# Patient Record
Sex: Male | Born: 1935 | Race: White | Hispanic: No | Smoking: Former smoker
Health system: Southern US, Community
[De-identification: ages and names within clinical notes are randomized; demographics above are authoritative.]

## PROBLEM LIST (undated history)

## (undated) DIAGNOSIS — I2510 Atherosclerotic heart disease of native coronary artery without angina pectoris: Secondary | ICD-10-CM

## (undated) DIAGNOSIS — K219 Gastro-esophageal reflux disease without esophagitis: Secondary | ICD-10-CM

## (undated) DIAGNOSIS — G4730 Sleep apnea, unspecified: Secondary | ICD-10-CM

## (undated) DIAGNOSIS — I10 Essential (primary) hypertension: Secondary | ICD-10-CM

## (undated) HISTORY — DX: Atherosclerotic heart disease of native coronary artery without angina pectoris: I25.10

## (undated) HISTORY — DX: Gastro-esophageal reflux disease without esophagitis: K21.9

---

## 2000-02-12 ENCOUNTER — Emergency Department (HOSPITAL_COMMUNITY): Admission: EM | Admit: 2000-02-12 | Discharge: 2000-02-13 | Payer: Self-pay | Admitting: Emergency Medicine

## 2000-05-24 ENCOUNTER — Ambulatory Visit (HOSPITAL_COMMUNITY): Admission: RE | Admit: 2000-05-24 | Discharge: 2000-05-24 | Payer: Self-pay | Admitting: Family Medicine

## 2000-05-25 ENCOUNTER — Encounter: Payer: Self-pay | Admitting: Family Medicine

## 2001-12-06 ENCOUNTER — Other Ambulatory Visit: Admission: RE | Admit: 2001-12-06 | Discharge: 2001-12-06 | Payer: Self-pay | Admitting: Dermatology

## 2003-01-19 ENCOUNTER — Encounter (INDEPENDENT_AMBULATORY_CARE_PROVIDER_SITE_OTHER): Payer: Self-pay | Admitting: Family Medicine

## 2004-01-25 ENCOUNTER — Inpatient Hospital Stay (HOSPITAL_COMMUNITY): Admission: AD | Admit: 2004-01-25 | Discharge: 2004-01-30 | Payer: Self-pay | Admitting: *Deleted

## 2005-01-18 HISTORY — PX: CORONARY ANGIOPLASTY WITH STENT PLACEMENT: SHX49

## 2005-01-29 ENCOUNTER — Encounter (INDEPENDENT_AMBULATORY_CARE_PROVIDER_SITE_OTHER): Payer: Self-pay | Admitting: Family Medicine

## 2005-01-29 LAB — CONVERTED CEMR LAB: Hgb A1c MFr Bld: 8.6 %

## 2005-09-10 ENCOUNTER — Ambulatory Visit: Payer: Self-pay | Admitting: Family Medicine

## 2005-09-11 ENCOUNTER — Encounter (INDEPENDENT_AMBULATORY_CARE_PROVIDER_SITE_OTHER): Payer: Self-pay | Admitting: Family Medicine

## 2005-09-24 ENCOUNTER — Ambulatory Visit: Payer: Self-pay | Admitting: Family Medicine

## 2005-09-24 LAB — CONVERTED CEMR LAB: Hgb A1c MFr Bld: 7.7 %

## 2005-11-24 ENCOUNTER — Ambulatory Visit: Payer: Self-pay | Admitting: Family Medicine

## 2005-12-24 ENCOUNTER — Ambulatory Visit: Payer: Self-pay | Admitting: Family Medicine

## 2005-12-24 LAB — CONVERTED CEMR LAB: Hgb A1c MFr Bld: 7.2 %

## 2005-12-28 ENCOUNTER — Encounter: Payer: Self-pay | Admitting: Family Medicine

## 2005-12-28 DIAGNOSIS — I10 Essential (primary) hypertension: Secondary | ICD-10-CM

## 2005-12-28 DIAGNOSIS — E785 Hyperlipidemia, unspecified: Secondary | ICD-10-CM

## 2005-12-28 DIAGNOSIS — I2510 Atherosclerotic heart disease of native coronary artery without angina pectoris: Secondary | ICD-10-CM | POA: Insufficient documentation

## 2005-12-28 DIAGNOSIS — K219 Gastro-esophageal reflux disease without esophagitis: Secondary | ICD-10-CM

## 2006-01-28 ENCOUNTER — Ambulatory Visit (HOSPITAL_COMMUNITY): Admission: RE | Admit: 2006-01-28 | Discharge: 2006-01-28 | Payer: Self-pay | Admitting: Family Medicine

## 2006-01-28 ENCOUNTER — Ambulatory Visit: Payer: Self-pay | Admitting: Family Medicine

## 2006-03-23 ENCOUNTER — Encounter (INDEPENDENT_AMBULATORY_CARE_PROVIDER_SITE_OTHER): Payer: Self-pay | Admitting: Family Medicine

## 2006-04-29 ENCOUNTER — Ambulatory Visit: Payer: Self-pay | Admitting: Family Medicine

## 2006-04-29 LAB — CONVERTED CEMR LAB
HDL goal, serum: 40 mg/dL
LDL Goal: 70 mg/dL

## 2006-05-02 ENCOUNTER — Encounter (INDEPENDENT_AMBULATORY_CARE_PROVIDER_SITE_OTHER): Payer: Self-pay | Admitting: Family Medicine

## 2006-05-03 LAB — CONVERTED CEMR LAB
ALT: 13 units/L (ref 0–53)
BUN: 21 mg/dL (ref 6–23)
CO2: 25 meq/L (ref 19–32)
Chloride: 104 meq/L (ref 96–112)
Creatinine, Ser: 1.17 mg/dL (ref 0.40–1.50)
Hemoglobin: 15.5 g/dL (ref 13.0–17.0)
LDL Cholesterol: 101 mg/dL — ABNORMAL HIGH (ref 0–99)
Lymphocytes Relative: 36 % (ref 12–46)
Monocytes Absolute: 0.5 10*3/uL (ref 0.2–0.7)
Monocytes Relative: 9 % (ref 3–11)
Neutro Abs: 2.7 10*3/uL (ref 1.7–7.7)
Neutrophils Relative %: 50 % (ref 43–77)
PSA: 0.38 ng/mL (ref 0.10–4.00)
Potassium: 4.7 meq/L (ref 3.5–5.3)
RBC: 5.1 M/uL (ref 4.22–5.81)
Total Protein: 6.7 g/dL (ref 6.0–8.3)
Triglycerides: 196 mg/dL — ABNORMAL HIGH (ref <150)
VLDL: 39 mg/dL (ref 0–40)
WBC: 5.5 10*3/uL (ref 4.0–10.5)

## 2006-05-20 ENCOUNTER — Ambulatory Visit: Payer: Self-pay | Admitting: Family Medicine

## 2006-05-30 ENCOUNTER — Encounter (INDEPENDENT_AMBULATORY_CARE_PROVIDER_SITE_OTHER): Payer: Self-pay | Admitting: Family Medicine

## 2006-06-20 ENCOUNTER — Ambulatory Visit: Payer: Self-pay | Admitting: Internal Medicine

## 2006-07-01 ENCOUNTER — Ambulatory Visit (HOSPITAL_COMMUNITY): Admission: RE | Admit: 2006-07-01 | Discharge: 2006-07-01 | Payer: Self-pay | Admitting: Internal Medicine

## 2006-07-01 ENCOUNTER — Ambulatory Visit: Payer: Self-pay | Admitting: Internal Medicine

## 2006-07-01 HISTORY — PX: COLONOSCOPY: SHX174

## 2006-07-11 ENCOUNTER — Encounter (INDEPENDENT_AMBULATORY_CARE_PROVIDER_SITE_OTHER): Payer: Self-pay | Admitting: Family Medicine

## 2006-07-15 ENCOUNTER — Ambulatory Visit: Payer: Self-pay | Admitting: Family Medicine

## 2006-07-15 DIAGNOSIS — N401 Enlarged prostate with lower urinary tract symptoms: Secondary | ICD-10-CM

## 2006-07-25 ENCOUNTER — Encounter (INDEPENDENT_AMBULATORY_CARE_PROVIDER_SITE_OTHER): Payer: Self-pay | Admitting: Family Medicine

## 2006-07-28 ENCOUNTER — Telehealth (INDEPENDENT_AMBULATORY_CARE_PROVIDER_SITE_OTHER): Payer: Self-pay | Admitting: Family Medicine

## 2006-08-26 ENCOUNTER — Ambulatory Visit: Payer: Self-pay | Admitting: Family Medicine

## 2006-08-26 LAB — CONVERTED CEMR LAB: Hgb A1c MFr Bld: 8.2 %

## 2006-08-29 ENCOUNTER — Ambulatory Visit: Payer: Self-pay | Admitting: Family Medicine

## 2006-08-30 ENCOUNTER — Telehealth (INDEPENDENT_AMBULATORY_CARE_PROVIDER_SITE_OTHER): Payer: Self-pay | Admitting: *Deleted

## 2006-08-30 LAB — CONVERTED CEMR LAB
ALT: 12 units/L (ref 0–53)
CO2: 24 meq/L (ref 19–32)
Cholesterol: 199 mg/dL (ref 0–200)
Creatinine, Ser: 1.09 mg/dL (ref 0.40–1.50)
Glucose, Bld: 146 mg/dL — ABNORMAL HIGH (ref 70–99)
Total Bilirubin: 0.6 mg/dL (ref 0.3–1.2)
Total CHOL/HDL Ratio: 6.2
Triglycerides: 185 mg/dL — ABNORMAL HIGH (ref <150)
VLDL: 37 mg/dL (ref 0–40)

## 2006-09-09 ENCOUNTER — Ambulatory Visit: Payer: Self-pay | Admitting: Family Medicine

## 2006-10-14 ENCOUNTER — Encounter (INDEPENDENT_AMBULATORY_CARE_PROVIDER_SITE_OTHER): Payer: Self-pay | Admitting: Family Medicine

## 2006-11-18 ENCOUNTER — Encounter (INDEPENDENT_AMBULATORY_CARE_PROVIDER_SITE_OTHER): Payer: Self-pay | Admitting: Family Medicine

## 2006-11-25 ENCOUNTER — Ambulatory Visit: Payer: Self-pay | Admitting: Family Medicine

## 2006-11-25 ENCOUNTER — Telehealth (INDEPENDENT_AMBULATORY_CARE_PROVIDER_SITE_OTHER): Payer: Self-pay | Admitting: *Deleted

## 2006-11-25 LAB — CONVERTED CEMR LAB
Glucose, Bld: 204 mg/dL
Hgb A1c MFr Bld: 7.5 %

## 2006-11-28 ENCOUNTER — Telehealth (INDEPENDENT_AMBULATORY_CARE_PROVIDER_SITE_OTHER): Payer: Self-pay | Admitting: *Deleted

## 2007-01-16 ENCOUNTER — Telehealth (INDEPENDENT_AMBULATORY_CARE_PROVIDER_SITE_OTHER): Payer: Self-pay | Admitting: *Deleted

## 2007-02-16 ENCOUNTER — Encounter (INDEPENDENT_AMBULATORY_CARE_PROVIDER_SITE_OTHER): Payer: Self-pay | Admitting: Family Medicine

## 2007-02-27 ENCOUNTER — Encounter (INDEPENDENT_AMBULATORY_CARE_PROVIDER_SITE_OTHER): Payer: Self-pay | Admitting: Family Medicine

## 2007-03-02 IMAGING — CR DG SHOULDER 2+V*L*
3 series · 3 of 3 positions shown · non-contrast
Comparison: none

CLINICAL DATA: Left shoulder pain.
 LEFT SHOULDER ? 3 VIEW:

[view not recorded (1 of 3)]
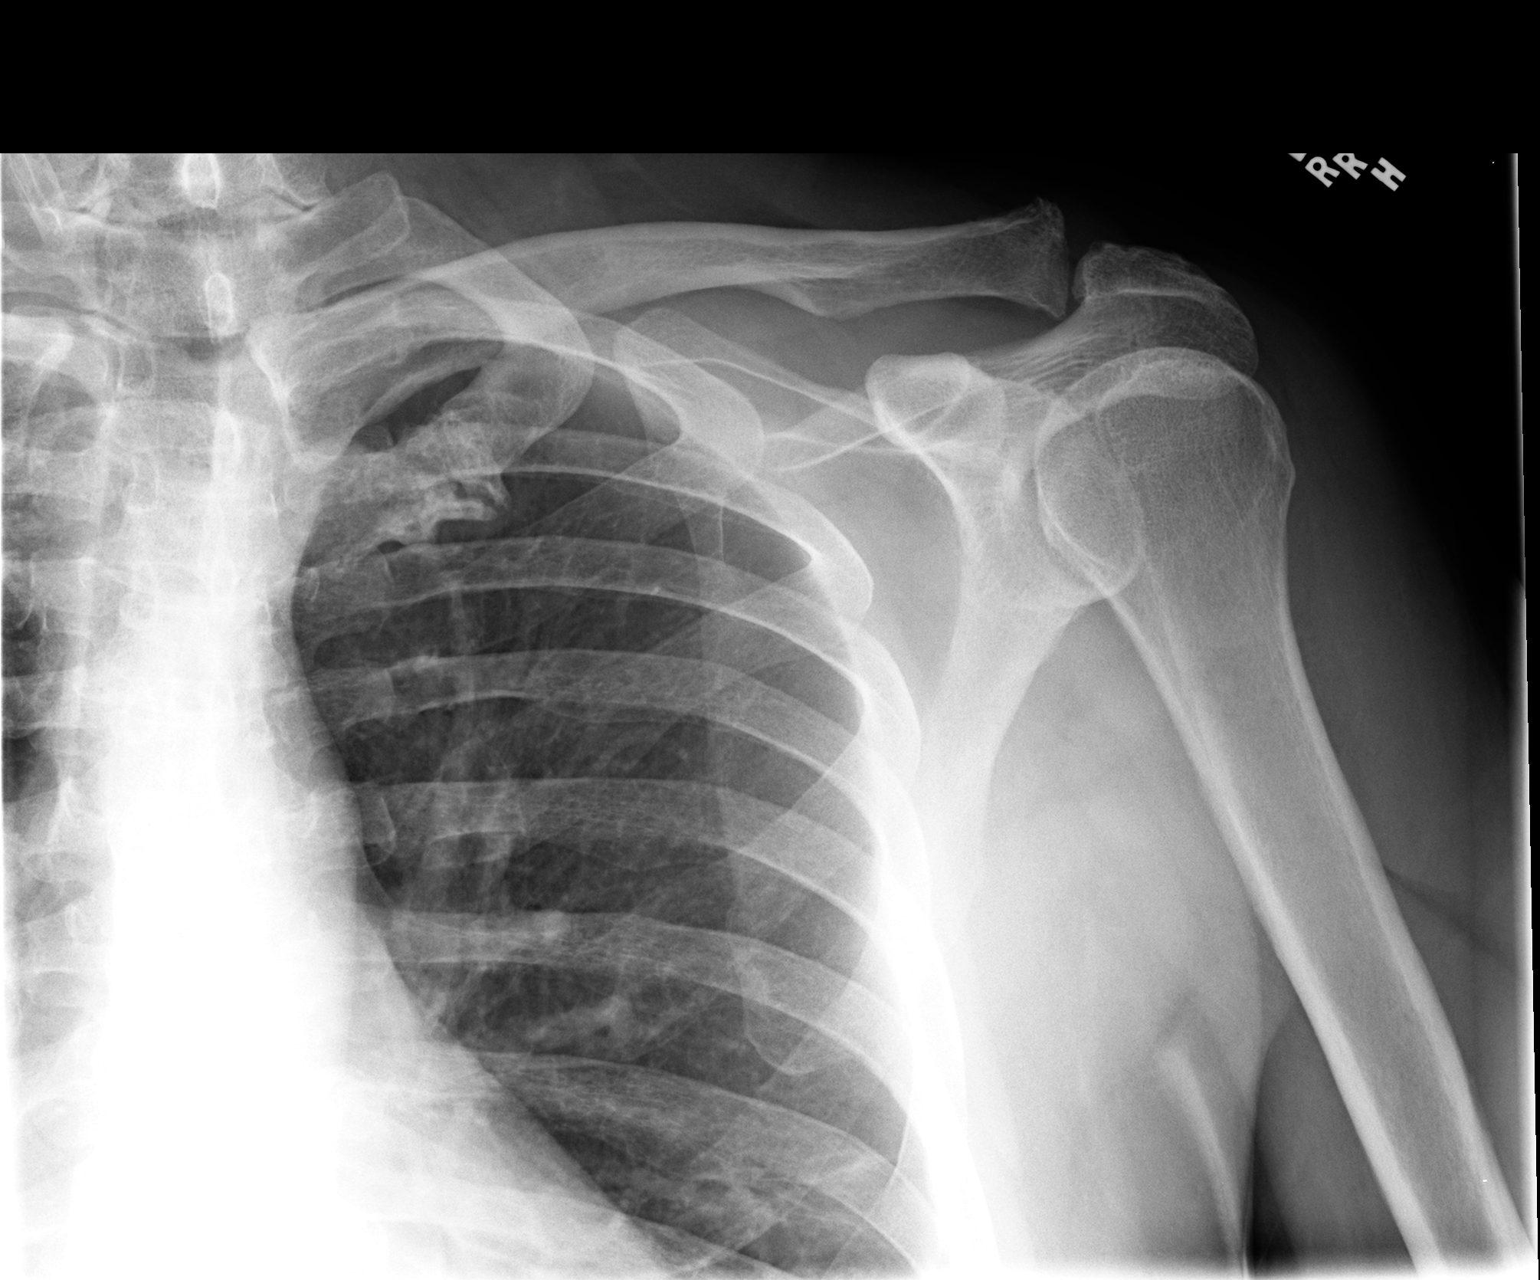

[view not recorded (2 of 3)]
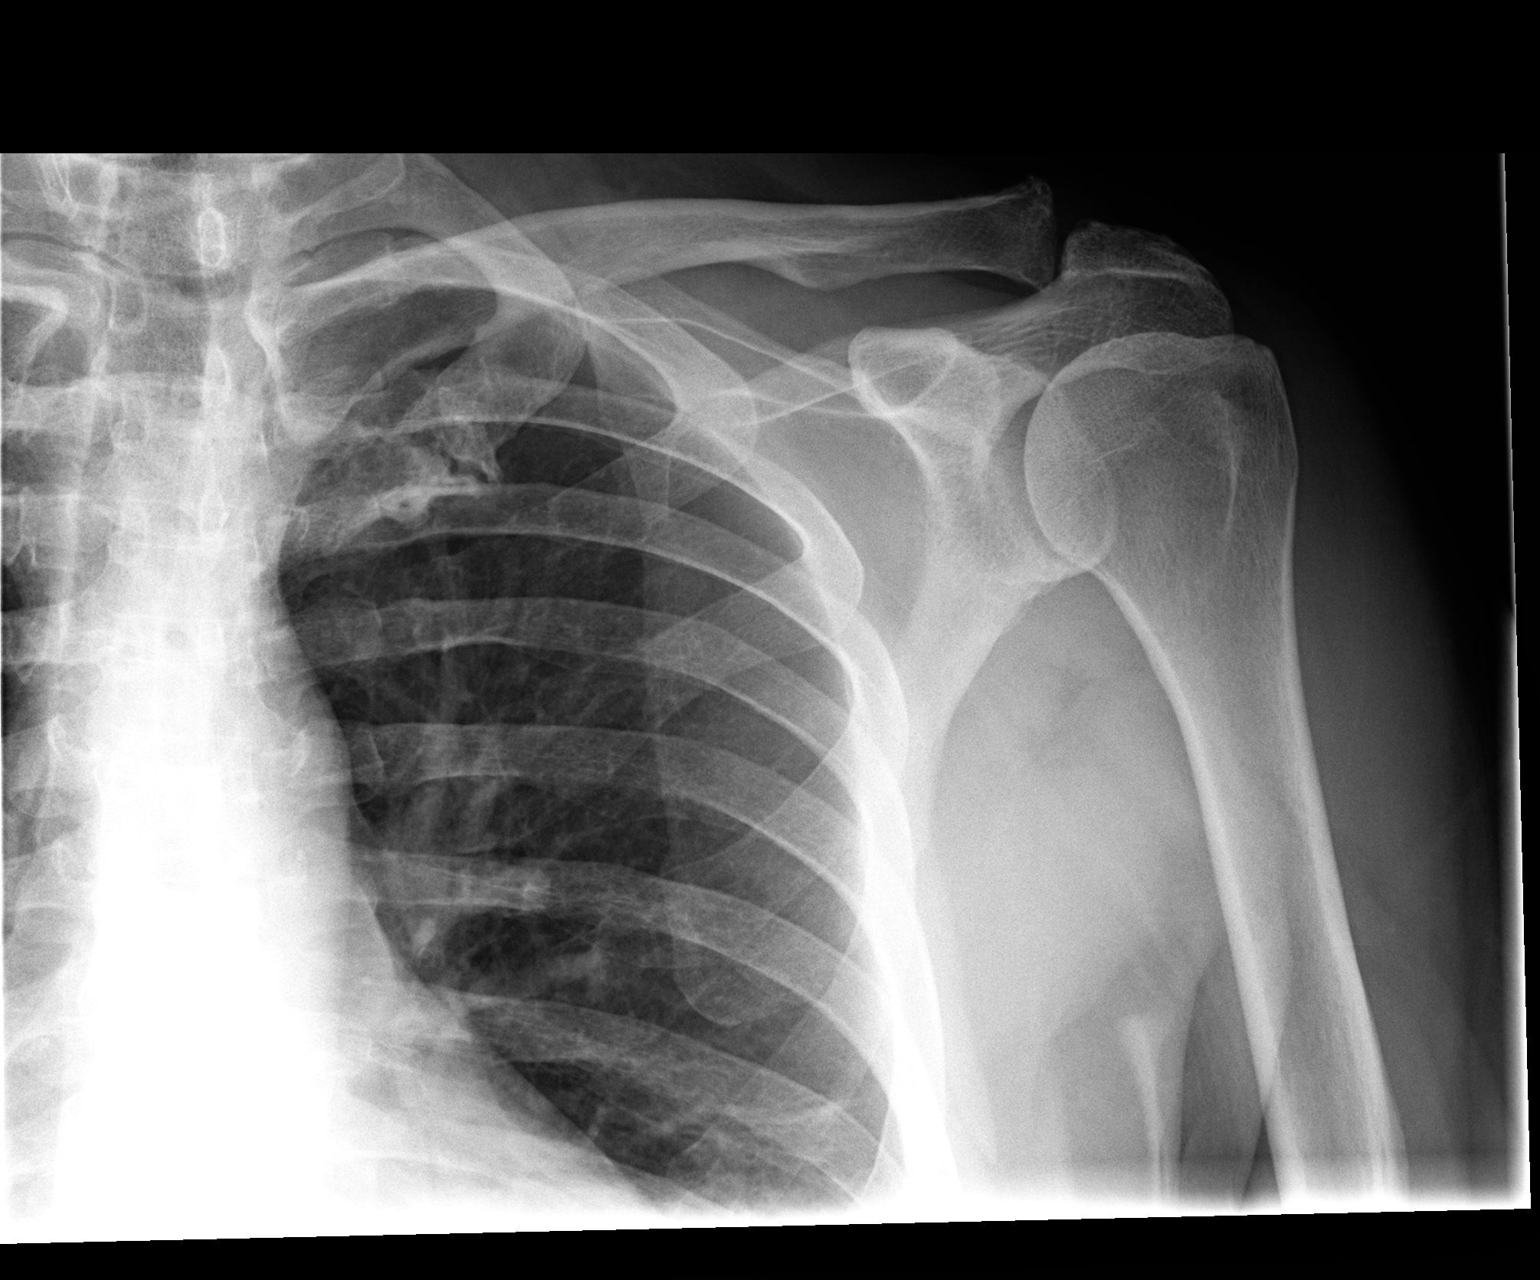

[view not recorded (3 of 3)]
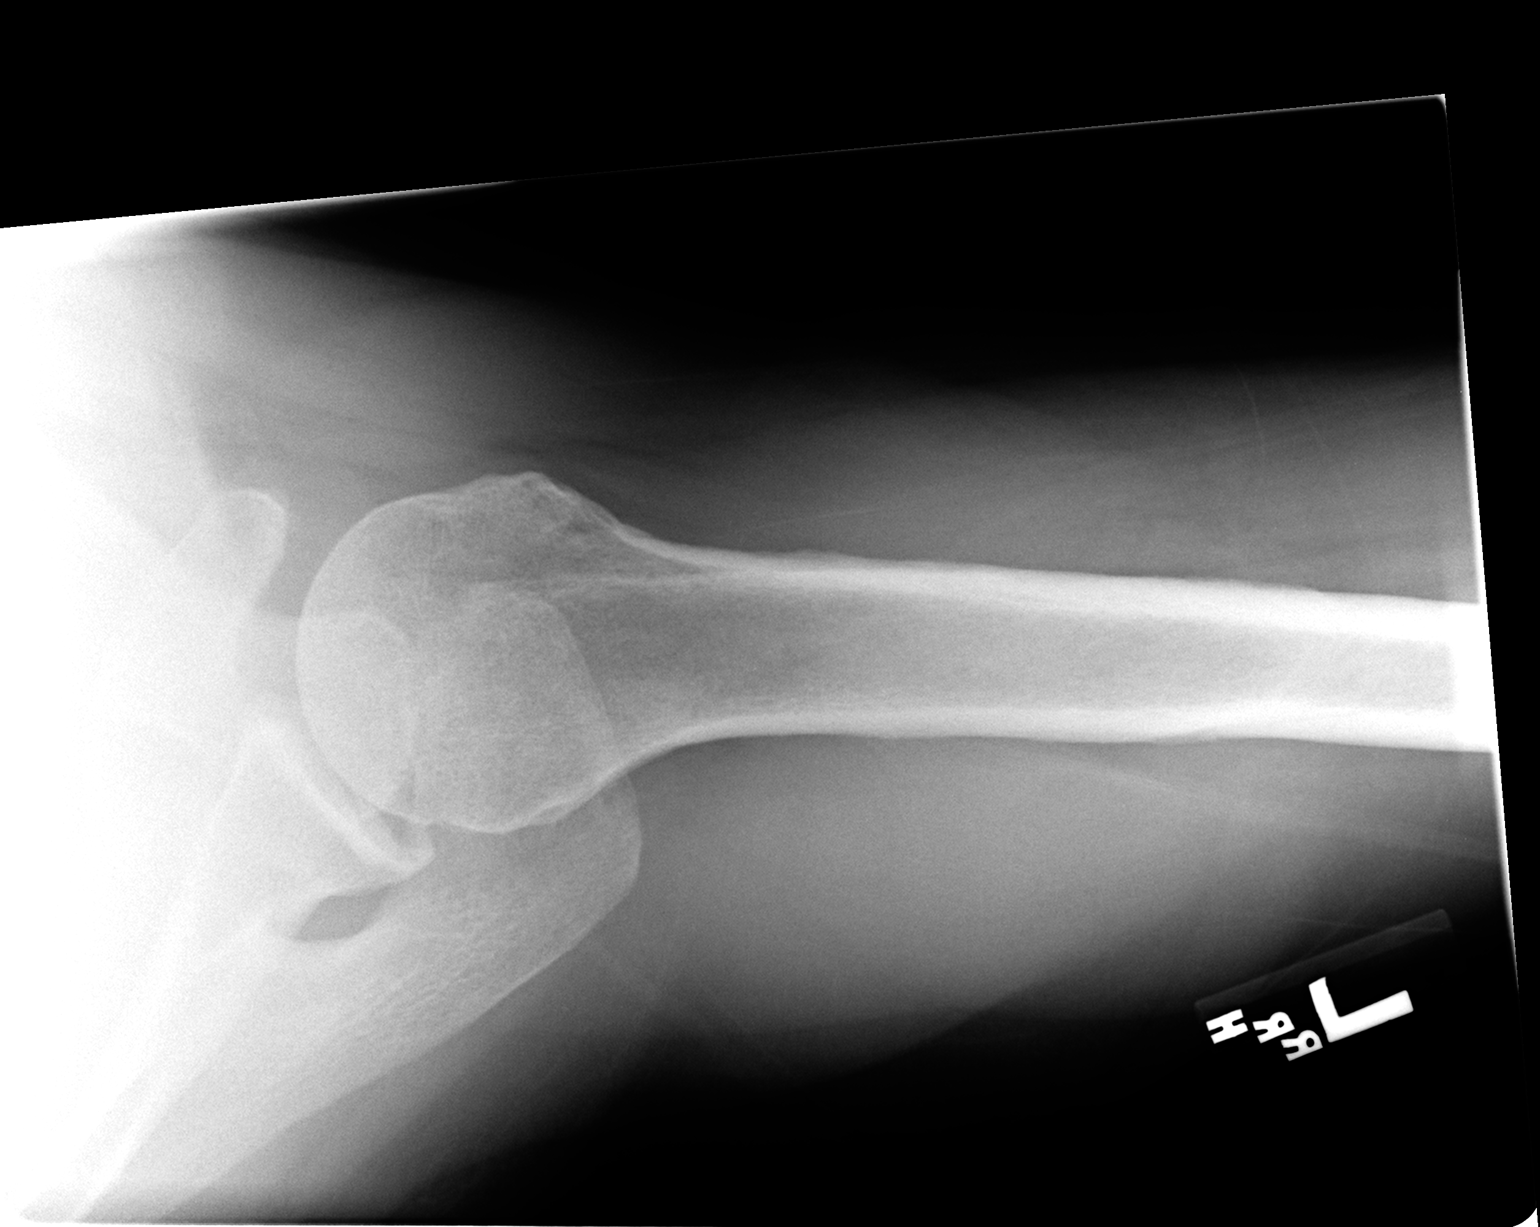

[3 of 3 positions shown; findings below may reference images not displayed]

FINDINGS: The glenohumeral joint is unremarkable.  There is a normal humeral-acromial distance.  The acromion is somewhat sloping, and there is hypertrophic degenerative disease of the AC joint, both of which could result in impingement.
IMPRESSION: No acute finding.  Unfavorable acromial anatomy and hypertrophic degenerative disease of the AC joint which could lead to impingement.

## 2007-03-10 ENCOUNTER — Ambulatory Visit: Payer: Self-pay | Admitting: Family Medicine

## 2007-03-10 LAB — CONVERTED CEMR LAB
Glucose, Bld: 222 mg/dL
Hgb A1c MFr Bld: 8.4 %

## 2007-03-29 ENCOUNTER — Encounter (INDEPENDENT_AMBULATORY_CARE_PROVIDER_SITE_OTHER): Payer: Self-pay | Admitting: Family Medicine

## 2007-04-13 ENCOUNTER — Encounter (INDEPENDENT_AMBULATORY_CARE_PROVIDER_SITE_OTHER): Payer: Self-pay | Admitting: Family Medicine

## 2007-06-06 ENCOUNTER — Encounter (INDEPENDENT_AMBULATORY_CARE_PROVIDER_SITE_OTHER): Payer: Self-pay | Admitting: Family Medicine

## 2007-06-06 LAB — CONVERTED CEMR LAB
AST: 16 units/L (ref 0–37)
Albumin: 4.4 g/dL (ref 3.5–5.2)
BUN: 19 mg/dL (ref 6–23)
Basophils Relative: 1 % (ref 0–1)
Calcium: 9.4 mg/dL (ref 8.4–10.5)
Chloride: 105 meq/L (ref 96–112)
Creatinine, Ser: 1.12 mg/dL (ref 0.40–1.50)
Eosinophils Absolute: 0.3 10*3/uL (ref 0.0–0.7)
Glucose, Bld: 114 mg/dL — ABNORMAL HIGH (ref 70–99)
HDL: 34 mg/dL — ABNORMAL LOW (ref >39)
Hemoglobin: 15.5 g/dL (ref 13.0–17.0)
Lymphs Abs: 2.3 10*3/uL (ref 0.7–4.0)
MCHC: 33.5 g/dL (ref 30.0–36.0)
MCV: 89.2 fL (ref 78.0–100.0)
Monocytes Absolute: 0.6 10*3/uL (ref 0.1–1.0)
Monocytes Relative: 10 % (ref 3–12)
Neutro Abs: 3 10*3/uL (ref 1.7–7.7)
PSA: 0.37 ng/mL (ref 0.10–4.00)
Potassium: 4.3 meq/L (ref 3.5–5.3)
RBC: 5.18 M/uL (ref 4.22–5.81)
Total CHOL/HDL Ratio: 5
Triglycerides: 183 mg/dL — ABNORMAL HIGH (ref <150)
WBC: 6.3 10*3/uL (ref 4.0–10.5)

## 2007-06-07 ENCOUNTER — Ambulatory Visit: Payer: Self-pay | Admitting: Family Medicine

## 2007-07-20 ENCOUNTER — Ambulatory Visit: Payer: Self-pay | Admitting: Family Medicine

## 2007-09-05 ENCOUNTER — Telehealth (INDEPENDENT_AMBULATORY_CARE_PROVIDER_SITE_OTHER): Payer: Self-pay | Admitting: *Deleted

## 2007-09-05 ENCOUNTER — Encounter (INDEPENDENT_AMBULATORY_CARE_PROVIDER_SITE_OTHER): Payer: Self-pay | Admitting: Family Medicine

## 2007-09-07 ENCOUNTER — Ambulatory Visit: Payer: Self-pay | Admitting: Family Medicine

## 2007-09-08 ENCOUNTER — Encounter (INDEPENDENT_AMBULATORY_CARE_PROVIDER_SITE_OTHER): Payer: Self-pay | Admitting: Family Medicine

## 2007-09-11 LAB — CONVERTED CEMR LAB
Albumin: 4.5 g/dL (ref 3.5–5.2)
BUN: 27 mg/dL — ABNORMAL HIGH (ref 6–23)
CO2: 21 meq/L (ref 19–32)
Calcium: 9 mg/dL (ref 8.4–10.5)
Chloride: 102 meq/L (ref 96–112)
Cholesterol: 158 mg/dL (ref 0–200)
Glucose, Bld: 122 mg/dL — ABNORMAL HIGH (ref 70–99)
HDL: 37 mg/dL — ABNORMAL LOW (ref >39)
LDL Cholesterol: 93 mg/dL (ref 0–99)
Potassium: 4.5 meq/L (ref 3.5–5.3)
Sodium: 136 meq/L (ref 135–145)
Total Protein: 7.2 g/dL (ref 6.0–8.3)
Triglycerides: 142 mg/dL (ref <150)

## 2007-09-21 ENCOUNTER — Ambulatory Visit: Payer: Self-pay | Admitting: Family Medicine

## 2007-09-21 DIAGNOSIS — H9190 Unspecified hearing loss, unspecified ear: Secondary | ICD-10-CM | POA: Insufficient documentation

## 2007-09-28 ENCOUNTER — Encounter (INDEPENDENT_AMBULATORY_CARE_PROVIDER_SITE_OTHER): Payer: Self-pay | Admitting: Family Medicine

## 2007-10-03 ENCOUNTER — Encounter (INDEPENDENT_AMBULATORY_CARE_PROVIDER_SITE_OTHER): Payer: Self-pay | Admitting: Family Medicine

## 2007-10-05 ENCOUNTER — Telehealth (INDEPENDENT_AMBULATORY_CARE_PROVIDER_SITE_OTHER): Payer: Self-pay | Admitting: *Deleted

## 2007-10-16 ENCOUNTER — Telehealth (INDEPENDENT_AMBULATORY_CARE_PROVIDER_SITE_OTHER): Payer: Self-pay | Admitting: *Deleted

## 2007-11-13 ENCOUNTER — Ambulatory Visit: Payer: Self-pay | Admitting: Family Medicine

## 2007-12-07 ENCOUNTER — Ambulatory Visit: Payer: Self-pay | Admitting: Family Medicine

## 2007-12-07 DIAGNOSIS — E1165 Type 2 diabetes mellitus with hyperglycemia: Secondary | ICD-10-CM

## 2008-02-01 ENCOUNTER — Ambulatory Visit: Payer: Self-pay | Admitting: Internal Medicine

## 2008-02-12 ENCOUNTER — Telehealth (INDEPENDENT_AMBULATORY_CARE_PROVIDER_SITE_OTHER): Payer: Self-pay | Admitting: *Deleted

## 2008-03-14 ENCOUNTER — Ambulatory Visit: Payer: Self-pay | Admitting: Family Medicine

## 2008-03-14 LAB — CONVERTED CEMR LAB
Glucose, Bld: 172 mg/dL
Hgb A1c MFr Bld: 7.5 %

## 2008-06-13 ENCOUNTER — Ambulatory Visit: Payer: Self-pay | Admitting: Family Medicine

## 2008-06-14 ENCOUNTER — Encounter (INDEPENDENT_AMBULATORY_CARE_PROVIDER_SITE_OTHER): Payer: Self-pay | Admitting: Family Medicine

## 2008-06-14 LAB — CONVERTED CEMR LAB
Creatinine, Urine: 245.4 mg/dL
Microalb, Ur: 1.31 mg/dL (ref 0.00–1.89)

## 2008-07-01 ENCOUNTER — Ambulatory Visit: Payer: Self-pay | Admitting: Family Medicine

## 2008-07-07 ENCOUNTER — Encounter (INDEPENDENT_AMBULATORY_CARE_PROVIDER_SITE_OTHER): Payer: Self-pay | Admitting: Family Medicine

## 2008-07-08 ENCOUNTER — Encounter (INDEPENDENT_AMBULATORY_CARE_PROVIDER_SITE_OTHER): Payer: Self-pay | Admitting: Family Medicine

## 2008-09-13 ENCOUNTER — Ambulatory Visit: Payer: Self-pay | Admitting: Family Medicine

## 2008-09-13 LAB — CONVERTED CEMR LAB: Hgb A1c MFr Bld: 7.9 %

## 2008-09-25 ENCOUNTER — Encounter (INDEPENDENT_AMBULATORY_CARE_PROVIDER_SITE_OTHER): Payer: Self-pay | Admitting: Family Medicine

## 2008-09-26 ENCOUNTER — Encounter (INDEPENDENT_AMBULATORY_CARE_PROVIDER_SITE_OTHER): Payer: Self-pay | Admitting: Family Medicine

## 2008-10-08 ENCOUNTER — Encounter (INDEPENDENT_AMBULATORY_CARE_PROVIDER_SITE_OTHER): Payer: Self-pay | Admitting: Family Medicine

## 2008-11-29 ENCOUNTER — Telehealth: Payer: Self-pay | Admitting: Internal Medicine

## 2009-02-06 ENCOUNTER — Ambulatory Visit: Payer: Self-pay | Admitting: Internal Medicine

## 2009-02-06 DIAGNOSIS — J309 Allergic rhinitis, unspecified: Secondary | ICD-10-CM | POA: Insufficient documentation

## 2009-06-02 ENCOUNTER — Ambulatory Visit (HOSPITAL_COMMUNITY): Admission: RE | Admit: 2009-06-02 | Discharge: 2009-06-02 | Payer: Self-pay | Admitting: Family Medicine

## 2009-12-19 ENCOUNTER — Ambulatory Visit (HOSPITAL_COMMUNITY): Admission: RE | Admit: 2009-12-19 | Discharge: 2009-12-19 | Payer: Self-pay | Admitting: Family Medicine

## 2010-02-15 LAB — CONVERTED CEMR LAB
Blood Glucose, Fasting: 167 mg/dL
Hgb A1c MFr Bld: 7.9 %

## 2010-02-17 NOTE — Assessment & Plan Note (Signed)
Summary: RESCHEDULED FROM 06.13.08   Vital Signs:  Patient Profile:   76 Years Old Male Height:     66 inches (167.64 cm) Weight:      179 pounds BMI:     29.00 O2 Sat:      96 % Temp:     97.3 degrees F Pulse rate:   77 / minute Resp:     16 per minute BP sitting:   123 / 78  Vitals Entered By: [name] (July 15, 2006 2:52 PM)               PCP:  Franchot Heidelberg, MD  Chief Complaint:  follow up visit.  History of Present Illness: Pt in for recheck.  He states he is doing well. He missed his first colonscopy. Had repeat about two weeks ago under Dr. Jena Gauss. States all was normal. He was told to have repeat in 3 to 5 years. Apetite good with no nausea or vomitting. Denies diarrhea and constipation. No blood in stool. He states he still strugglkes with some GERD. Uses Protonix occasionally. Notes he uses it mostly with spicey meals. Notes sx worse after meals.  He states his right testicular pain is better. Notes no dysuria, frequency. No nocturia. He urinates once or twice and has slowing of his stream. Worried about BPH.  He has just increased his Simvastatin. He has no body aches. He denies sleep disturbance. No myalgias. Trying to wtch his diet.  Now presents.    Current Allergies (reviewed today): No known allergies   Past Medical History:    Reviewed history from 12/28/2005 and no changes required:       Coronary artery disease       Diabetes mellitus, type II       GERD       Hyperlipidemia       Hypertension  Past Surgical History:    Reviewed history from 12/28/2005 and no changes required:       stent implants 01.01.06   Family History:    Reviewed history from 04/29/2006 and no changes required:       father: COPD       mother: COPD/ cirrhosis       3 sisters: 1 deceased with colon CA                        2 CAD/ COPD       2 brothers: CAD/ COPD  Social History:    Reviewed history from 04/29/2006 and no changes required:       Married       Never Smoked       Alcohol use-no       Drug use-no    Review of Systems      See HPI   Physical Exam  General:     Well-developed,well-nourished,in no acute distress; alert,appropriate and cooperative throughout examination Lungs:     Normal respiratory effort, chest expands symmetrically. Lungs are clear to auscultation, no crackles or wheezes. Heart:     Normal rate and regular rhythm. S1 and S2 normal without gallop, murmur, click, rub or other extra sounds. Abdomen:     Bowel sounds positive,abdomen soft and non-tender without masses, organomegaly or hernias noted. Extremities:     No clubbing, cyanosis, edema, or deformity noted with normal full range of motion of all joints.      Impression & Recommendations:  Problem # 1:  HYPERLIPIDEMIA (ICD-272.4) Discussed.  Tolerating statin. Claims he got bill for over $300 from [employer] insurance would not pay. They have refiled claim and he is waiting to see otucome. As he has done well with this in past curious if we can defer LFTs. Advised certainly would not be inappropriate to weight 6 more weeks. Will do at that time. Councelled TLC. His updated medication list for this problem includes:    Niaspan 500 Mg Tbcr (Niacin (antihyperlipidemic)) .Marland Kitchen... At bedtime    Simvastatin 40 Mg Tabs (Simvastatin) ..... One at bedtime   Problem # 2:  TESTICULAR PAIN, RIGHT (ICD-608.9) Resolved. Follow and update if redevelops.  Problem # 3:  BENIGN PROSTATIC HYPERTROPHY, WITH OBSTRUCTION (ICD-600.01) Trial Flomax. Risk and benefit discussed. Handout given.  Problem # 4:  GERD (ICD-530.81) Use Protonix daily - supsetc throat clearing post meals due to uncontrolled GERD. Get records on scope from GI. Councelled diet, exersize and weight loss with need to use med daily a must. The following medications were removed from the medication list:    Omeprazole 20 Mg Cpdr (Omeprazole) ..... Once daily  His updated medication list for  this problem includes:    Protonix 40 Mg Tbec (Pantoprazole sodium) ..... Once daily   Medications Added to Medication List This Visit: 1)  Flomax 0.4 Mg Cp24 (Tamsulosin hcl) .... One daily   Patient Instructions: 1)  Please schedule a follow-up appointment in 6 weeks.    Prescriptions: FLOMAX 0.4 MG  CP24 (TAMSULOSIN HCL) One daily  #30 x 3   Entered and Authorized by:   Franchot Heidelberg MD   Signed by:   Franchot Heidelberg MD on 07/15/2006   Method used:   Print then Give to Patient   RxID:   [id number]   Handout requested.

## 2010-02-17 NOTE — Assessment & Plan Note (Signed)
Summary: recurring cough-self ref/apc   Primary Provider/Referring Provider:  Dr. John Giovanni  CC:  Pulmonary Consult.  c/o coughing after eating-productive cough with yellow phelgm x74yrs.  states prevacid helps with this.  also c/o wheezing on and off x1 1/2 yrs.  .  History of Present Illness: 20-Feb-2009- 76 yoM referred by his wife Patsy, our patient, because of cough and wheeze. he describes cough over the past 2 years with yellow phlegm. Intermittent. Prevacid may help. Occasional nose whistle with deep breath. Sneeze, little discharge. Denies significant wheeze or dyspnea. occasional chokes eating and will cough up phlegm after meals. Uncomfortable in pollen seasons. Hx of allergic rhinitis, chest colds. No hx asthma or sinus headache. No hx pneumonia. Had flu and pneumovax.      Current Medications (verified): 1)  Metoprolol Succinate 25 Mg Tb24 (Metoprolol Succinate) .... Two Times A Day 2)  Niaspan 1000 Mg  Tbcr (Niacin (Antihyperlipidemic)) .... One At Bedtime 3)  Simvastatin 40 Mg Tabs (Simvastatin) .... One At Bedtime 4)  Aspirin 81 Mg Tbec (Aspirin) .... Once Daily 5)  Plavix 75 Mg Tabs (Clopidogrel Bisulfate) .... Once Daily 6)  Benazepril-Hydrochlorothiazide 10-12.5 Mg Tabs (Benazepril-Hydrochlorothiazide) .... Once Daily 7)  Nitroquick 0.4 Mg Subl (Nitroglycerin) .... As Needed 8)  Glipizide 10 Mg Tabs (Glipizide) .... One in Am and One Night 9)  Fish Oil 1200 Mg Caps (Omega-3 Fatty Acids) .... One Daily 10)  Lantus Solostar 100 Unit/ml Soln (Insulin Glargine) .... 35 Units [place] Qhs 11)  Pen Needles 1/2" 29g X 12mm Misc (Insulin Pen Needle) .... 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  Risk Factors: Exercise: no (04/29/2006)  Risk Factors: Smoking Status: never (04/29/2006)  Past Medical History: BENIGN PROSTATIC HYPERTROPHY, WITH OBSTRUCTION (ICD-600.01) PREVENTIVE HEALTH CARE (ICD-V70.0) HYPERTENSION (ICD-401.9) HYPERLIPIDEMIA (ICD-272.4) GERD (ICD-530.81) DIABETES MELLITUS, TYPE II, UNCONTROLLED (ICD-250.02) CORONARY ARTERY DISEASE (ICD-414.00)  Family History: father: COPD Dead 28 mother: COPD/ cirrhosis Dead 14 3 sisters: 1 deceased with colon CA - 56                  2 dead at 61 - medication overdose with suicide/ COPD 79 2 brothers: 84  and 47 CAD/ COPD allergies-daughter  Social History: Married Never Smoked Alcohol use-no Drug use-no 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  The patient denies shortness of breath at rest, non-productive cough, coughing up blood, chest pain, irregular heartbeats, loss of appetite, weight change, abdominal pain, difficulty swallowing, sore throat, tooth/dental problems, headaches, nasal congestion/difficulty breathing through nose, sneezing, itching, ear ache, anxiety, depression, hand/feet swelling, joint stiffness or pain, rash, change in color of mucus, and fever.         Loud snore without apnea described. Some daytime sleepiness with frequent naps.  Vital Signs:  Patient profile:  76 year old male Height:      64 inches Weight:      178 pounds BMI:     30.66 O2 Sat:      96 % on Room air Pulse rate:   51 / minute BP sitting:   140 / 70  (left arm) Cuff size:   regular  Vitals Entered  By: Gweneth Dimitri RN (February 06, 2009 9:23 AM)  O2 Flow:  Room air CC: Pulmonary Consult.  c/o coughing after eating-productive cough with yellow phelgm x80yrs.  states prevacid helps with this.  also c/o wheezing on and off x1 1/2 yrs.   Comments Medications reviewed with patient phone number verified with pt Gweneth Dimitri RN  February 06, 2009 9:23 AM    Physical Exam  Additional Exam:  General: A/Ox3; pleasant and cooperative, NAD, mildly overweight SKIN: no rash, lesions NODES: no lymphadenopathy HEENT: [place]/AT, EOM- WNL, hard of hearing, Conjuctivae- clear, PERRLA, TM-WNL, Nose- red mucosa with mucus bridging, Throat- clear and wnl NECK: Supple w/ fair ROM, JVD- none, normal carotid impulses w/o bruits Thyroid- normal to palpation CHEST: Clear to P&A, dry cough only with deep breath HEART: RRR, no m/g/r heard ABDOMEN: Soft and nl; nml bowel sounds; no organomegaly or masses noted UJW:JXBJ, nl pulses, no edema  NEURO: Grossly intact to observation      Impression & Recommendations:  Problem # 1:  ALLERGIC RHINITIS (ICD-477.9)  Sounds like perennial rhinitis with seasonal pollen exacerbation. We will try claritin and a sample steroid nasal spray.  Problem # 2:  ? of SLEEP APNEA, OBSTRUCTIVE (ICD-327.23)  He describes his wife's comments, and probably does have sleep apnea. I've asked him to get a more direct description as to whether he is seen to stop breathing, and I will give a print information booklet. Based on his wife's observation, we may want to do a sleep study.  Other Orders: New Patient Level III (47829)  Patient Instructions: 1)  Please schedule a follow-up appointment in 1 month. 2)  Try otc antihistamine Claritin/ loratadine for itching and sinus drainage as needed. 3)  Try sample Nasonex nasal steroid spray: 2 sprays each nostril once daily til you use up the sample. If it is a big help we will give a prescription. 4)  Look at pamphlet about sleep apnea and  ask your wife if that is what you sound like. We can do a sleep study if needed.

## 2010-06-02 NOTE — Consult Note (Signed)
NAMELASARO, PRIMM             ACCOUNT NO.:  [id number]   MEDICAL RECORD NO.:  [id number]          PATIENT TYPE:  AMB   LOCATION:                                FACILITY:  [facility]   PHYSICIAN:  R. Roetta Sessions, M.D. DATE OF BIRTH:  Nov 07, 1934   DATE OF CONSULTATION:  06/20/2006  DATE OF DISCHARGE:                                 CONSULTATION   REASON FOR CONSULTATION:  1. Consideration of colorectal cancer screening.  2. History of positive family history of colon cancer.   HISTORY OF PRESENT ILLNESS:  Mr. Eoghan Belcher is a very pleasant  76 year old Caucasian male sent over at the courtesy of Dr. Franchot Heidelberg for consideration of colorectal cancer screening.  Mr.  Ruffins tells me he had a colonoscopy by Dr. Elpidio Anis back in 1998.  He was found to have a polyp which was ablated, pathology unknown to me.  He has no bowel symptoms currently.   FAMILY HISTORY:  Positive for colorectal cancer in a sister who was  diagnosed with the disease at age 92.  He has not had any imaging of his  colon since 1998.  He has occasional reflux symptoms for which he takes  pantoprazole 40 mg orally daily.  No odynophagia, dysphagia, early  satiety, nausea or vomiting.  No abdominal pain.  He denies weight loss,  melena, hematochezia, constipation, diarrhea.   PAST MEDICAL HISTORY:  Significant for:  1. Type 2 diabetes mellitus.  2. Hypertension.  3. Gastroesophageal reflux disease.  4. History of coronary artery disease.   PAST SURGICAL:  Coronary angiography with stent placement in 2006.   CURRENT MEDICATIONS:  1. Glyburide 5 mg tablet two b.i.d.  2. Plavix 75 mg daily.  3. NitroQuick 0.4 mg p.r.n.  4. Benazepril 10/12.5 daily.  5. Simvastatin 40 mg at bedtime.  6. Niaspan 500 mg at bedtime.  7. Pantoprazole 40 mg daily.  8. Metoprolol 25 mg daily.  9. Ecotrin 81 mg daily.   ALLERGIES:  NO KNOWN DRUG ALLERGIES.   FAMILY HISTORY:  1. Mother succumbed to old age,  diet at age 67.  2. Father died at age 41.  3. One sister succumbed to colon cancer.  4. One brother died with some respiratory problems.   SOCIAL HISTORY:  1. Patient is married and has 4 children, 1 girl and 3 boys.  2. He drives a truck part-time.  No tobacco, no alcohol, no illicit      drugs.   REVIEW OF SYSTEMS:  As in History of Present Illness.  Has not had any  recent chest pain, dyspnea on exertion.  No change in weight by history.   PHYSICAL EXAMINATION:  A pleasant 76 year old gentleman resting  comfortably.  Weight 179, height 5 feet 4 inches, temp 97.4, BP 120/70,  pulse 60.  Skin warm and dry.  He has a couple of telangiectasia white  lesions in the malar area.  No scleral icterus.  Conjunctiva are pink.  CHEST:  Lungs are clear to auscultation.  CARDIAC EXAM:  Regular rate and rhythm without murmur, gallop or rub.  ABDOMEN:  Nondistended, somewhat protuberant; however, positive bowel  sounds.  Soft, nontender, without appreciable mass or organomegaly.  EXTREMITIES:  No edema.  RECTAL EXAM:  Deferred at the time of colonoscopy.   IMPRESSION:  Mr. Bloom is a pleasant 76 year old Caucasian male with  a personal history of colonic polyps and a positive family history of  colon cancer in a first degree relative/colonoscopy was in 1998.  He has  no lower GI tract symptoms at this time.   He is somewhat overdue for surveillance colonoscopy.   RECOMMENDATIONS:  I have recommended Mr. Streety to have colonoscopy  pretty much in the very near future.  Potential risks, benefits and  alternatives have been reviewed and questions answered, he is agreeable.  Will plan to hold aspirin and Plavix for 4 days prior to the procedure.  Will make further recommendations in the very near future.   I would like to thank Dr. Franchot Heidelberg for allowing me to see this  nice gentleman today.      Jonathon Bellows, M.D.  Electronically Signed     RMR/MEDQ  D:   06/20/2006  T:  06/20/2006  Job:  [id number]

## 2010-06-02 NOTE — Op Note (Signed)
Christopher Fields, WENTWORTH             ACCOUNT NO.:  [id number]   MEDICAL RECORD NO.:  [id number]          PATIENT TYPE:  AMB   LOCATION:  DAY                           FACILITY:  [facility]   PHYSICIAN:  R. Roetta Sessions, M.D. DATE OF BIRTH:  08/19/1934   DATE OF PROCEDURE:  07/01/2006  DATE OF DISCHARGE:                               OPERATIVE REPORT   PROCEDURE:  Screening colonoscopy.   INDICATIONS FOR PROCEDURE:  The patient is a 76 year old gentleman with  a distant history colonic polyps removed by Dr. Katrinka Blazing previously and a  positive family history of colon cancer for a first-degree relatives  (sister),  somewhat overdue for screening.  He has not really had any  lower GI tract symptoms recently.  Colonoscopy is now being done.  This  approach has been discussed with the patient at length.  Potential  risks, benefits and alternatives have been reviewed and questions  answered.  Please see documentation in the medical record.   DESCRIPTION OF PROCEDURE:  Oxygen saturation, blood pressure, pulse and  respiration were monitored throughout the entire procedure.  Conscious  sedation with Versed 3 mg IV and Demerol 50 mg IV in divided doses.  The  instrument was the Pentax video chip system.   FINDINGS:  Digital rectal exam revealed no abnormalities.  The prep was  good.  Examination colonic mucosa was undertaken from the rectosigmoid  junction through the left, transverse, right colon to the appendiceal  orifice, ileocecal valve and cecum.  These structures well seen  photographed for the record.  From this level scope was slowly  withdrawn.  Previous mucosal surfaces were again seen.  The colonic  mucosa appeared normal.  Scope was pulled down into the rectum.  A  thorough examination of the rectal mucosa and retroflex view of the anal  verge demonstrated no abnormality.  The patient tolerated the procedure  well was reacted to endoscopy.   IMPRESSION:  Normal rectum, normal colon.   RECOMMENDATIONS:  Repeat high-risk screening/surveillance colonoscopy in  5 years.  .      R. Roetta Sessions, M.D.  Electronically Signed     RMR/MEDQ  D:  07/01/2006  T:  07/02/2006  Job:  [id number]   cc:   Franchot Heidelberg, M.D.

## 2010-06-05 NOTE — Discharge Summary (Signed)
Fields, Christopher             ACCOUNT NO.:  [id number]   MEDICAL RECORD NO.:  [id number]          PATIENT TYPE:  INP   LOCATION:  A222                         FACILITY:  [facility]   PHYSICIAN:  Mary B. Easley, P.A.-C.DATE OF BIRTH:  [birth year]   DATE OF ADMISSION:  01/25/2004  DATE OF DISCHARGE:  01/28/2004                                 DISCHARGE SUMMARY   ADMISSION DIAGNOSES:  1.  Unstable angina.  2.  Diabetes mellitus type 2.  3.  Hypertension.  4.  Dyslipidemia.  5.  Gastroesophageal reflux disease.  6.  Obesity.   DISCHARGE DIAGNOSES:  1.  Unstable angina.  2.  Diabetes mellitus type 2.  3.  Hypertension.  4.  Dyslipidemia.  5.  Gastroesophageal reflux disease.  6.  Obesity.  7.  Status post cardiac catheterization, Dr. Lavonne Chick, 01/29/04.  He      performed two vessel percutaneous coronary  intervention to the proximal      left anterior descending and the ostial diagonal 1.  Each of these went      from 99% to stenosis.  He had good result.  See the dictated report for      details.  The patient had some residual disease including 74-70% ostial      posterior descending artery, 70% midright coronary artery.  At that      point Dr. Elsie Lincoln was uncertain of the significance of disease and      planned for an outpatient Cardiolite to follow this up.   HISTORY OF PRESENT ILLNESS:  Christopher Fields is a 76 year old white male with  a history of diabetes, hypertension, hyperlipidemia, GERD.  He presented to  [facility] on 01/25/04 with a two-day history of chest pain.  The  chest pain was described as heavy retrosternal mainly occurring without  exertion.  As well, he had had some episodes with diaphoresis, but no  radiation of the pain.  There had been no aggravating or alleviating  factors.  The first episode had occurred at about 7:30 a.m. the previous day  while the patient was driving to work.  It lasted 5-10 minutes and then went  away.  Subsequently returned  the day prior to admission where he was able to  work for nine hours, chest pain recurred during the night of admission and  he became concerned and came to the ER.   On exam he was he was stable, blood pressure 131/61, heart rate 69.  Initial  cardiac enzymes were negative.  EKG showed sinus rhythm and nonspecific  changes.  At this point, he was evaluated by Dr. Mikeal Hawthorne, admitted to [facility] per Dr. Maxwell Caul orders.   HOSPITAL COURSE:  He was followed over the next couple of days by Dr.  Maxwell Caul service.  This is while at [facility] and I do not have all of the  records.  On 01/28/04, Dr. Domingo Sep was consulted for cardiology evaluation.  At that point she felt this was consistent with unstable angina.  She  planned to stop the Lovenox and changed to IV heparin.  She  planned for IV  nitroglycerin.  It is planned to continue aspirin, statin, ACE inhibitor,  proton pump.  She planned to hold metformin and planned for transfer to  [facility] for cardiac catheterization.   On 01/29/04, Christopher Fields was transferred to [facility].  At this point, his  enzymes were negative.  He had some lateral T wave conversions on EKG.  He  is currently without chest pain.  He is on no heparin or nitroglycerin and  awaiting cath.   Later on 01/29/04, he underwent cardiac catheterization by Dr. Lavonne Chick.  He performed intervention to the proximal LAD which went from 99% to 0  residual.  He also had performed interventions to the ostial diagonal which  went from 99% to 30% residual.  Please see his dictated report for all  details.  The patient was also found to have a 60-75% ostial PDA lesion and  a 30% mid-RCA stenosis of questionable significance.  He had normal  LV  function, normal abdominal aorta, and normal renal arteries.  Dr. Elsie Lincoln  planned to continue Plavix and other medication.  He was given outpatient  Cardiolite and felt that the patient could be discharged in the morning  if  stable.   On 01/30/04, Christopher Fields was doing well without chest pain or shortness of  breath.  He was afebrile at 98.5, pulse 65, blood pressure 95-115/45-55.  Oxygen saturation 96% on 2 L.  Labs were stable including creatinine 1.0  postcath.  Hemoglobin stable 13.0, hematocrit 37.1.  Groin site was stable.  At this point he is seen and evaluated by Dr. Yates Decamp who deems him stable  for discharge home.   HOSPITAL CONSULTS:  Initially Christopher Fields is on the service of Dr. Mikeal Hawthorne  at [facility].  Initially, Dr. Domingo Sep was the consulting  physician for cardiologist.  However, at the time of transfer to [facility] the patient was on the service of Dr. Domingo Sep, cardiology.  There  were no further consulting physicians at [facility].   EKG had shown some sinus rhythm with some T wave inversions laterally.   RADIOLOGY:  Chest x-ray on 01/25/04, shows mild cardiomegaly, bibasilar  atelectasis.   LABORATORY DATA:  At the time of admission to [facility] on 1/7, CBC shows  white count 5.4, hemoglobin 14.3, hematocrit 41.9, platelet 257.  Sodium  131, potassium 3.7, BUN 14, creatinine 14.  Cardiac markers there are  negative x3.  Subsequent cardiac panels shows first CK 73, MB 1.7, troponin  less than 0.01.  Second set CK 50, MB 1.3, troponin 0.01.  Next, CK 38, MB  1.0, troponin 0.02.  TSH is slightly low at 0.160.  Hemoglobin A1C elevated  at 9.5.  Liver profile shows total cholesterol 143, triglyceride 189, HDL  22, LDL 23.  Repeat TSH is high at 6.023 and free T4 at that time is 0.76.  Because of these mixed results will have his primary care physician followup  his thyroid functions as an outpatient.  He had cardiac enzymes on 1/11 post  PCI which were negative.  Date of discharge 1/12, electrolytes showed sodium  140, potassium 3.7, BUN 12, creatinine 10.  On that same date CBC shows white count 7.1, hemoglobin 13.0, hematocrit 37.1, platelets 222.   DISCHARGE  MEDICATIONS:  1.  Plavix 75 mg once a day.  2. Aspirin 81 mg once a day.  3. Stopped her      lovastatin.  4. Zocor 20 mg at night.  5.      Niaspan 500 mg at night.  6. Omeprazole 20 mg a day.  7. Glyburide.  8.      Metformin:  Do not take this today or tomorrow, go back to the previous      dose starting 02/01/04.  9. Benazepril hydrochlorothiazide as before.      10. Nitroglycerin as directed.       MBE/MEDQ  D:  01/30/2004  T:  01/30/2004  Job:  [id number]   cc:   Vania Rea, M.D.

## 2010-06-05 NOTE — Discharge Summary (Signed)
[name], Fields NO.:  [id number]   MEDICAL RECORD NO.:  [id number]          PATIENT TYPE:  INP   LOCATION:  A222                          FACILITY:  [facility]   PHYSICIAN:  Christopher Fields, M.D. DATE OF BIRTH:  [birth year]   DATE OF ADMISSION:  01/25/2004  DATE OF DISCHARGE:  01/10/2006LH                                 DISCHARGE SUMMARY   PRIMARY CARE PHYSICIAN:  Christopher Fields. Christopher Fields, M.D.   CARDIOLOGIST:  Christopher Fields, M.D.   DISCHARGE DIAGNOSES:  1.  Unstable angina/acute coronary syndrome.  2.  Diabetes type 2, uncontrolled.  3.  Hypertension, controlled.  4.  Dyslipidemia.  5.  History of gastroesophageal reflux disease.  6.  Obesity.   DISPOSITION:  Transferred to [facility] for cardiac  catheterization.   DISCHARGE CONDITION:  Stable.   DISCHARGE MEDICATIONS:  1.  Aspirin 325 mg daily.  2.  Zocor 10 mg each evening.  3.  Protonix 40 mg daily.  4.  Glyburide 5 mg daily.  5.  Metformin 500 mg daily.  6.  Benazepril 10 mg daily.  7.  Heparin drip.  8.  Nitroglycerin by drip.  9.  Morphine sulfate 2-4 mg q.4h. p.r.n.   HOSPITAL COURSE:  Please refer to admission history and physical from  January 25, 2004.  This is a 75 year old Caucasian man with a history of  diabetes, hypertension, dyslipidemia, GERD, who presented with a 2-day  history of intermittent chest pain, which was relieved by nitroglycerin in  the emergency room.  He was admitted on a rule out myocardial infarction  protocol, and while in the hospital continued to have episodes of chest pain  after meals, at all times relieved by nitroglycerin.  He was evaluated by  the cardiologist this morning, and while she was interviewing him, he had an  impressive episode of chest pain relieved by nitroglycerin, and she elected  to transfer him to [facility] to further evaluation his coronary arteries.  His  cardiac enzymes over the course of his admission have been negative.   PHYSICAL EXAMINATION:  GENERAL:  This morning, the patient, when seen, was  alert and oriented, and had no acute complaints.  VITAL SIGNS:  Temperature 97.1, pulse 58, respirations 18, blood pressure  101/50.  His fasting blood sugar was 169.  His bedtime blood sugar was 132.  CHEST:  Clear to auscultation bilaterally.  CARDIOVASCULAR:  Regular rhythm.  ABDOMEN:  Obese, soft, and nontender.  EXTREMITIES:  Without edema.   LABORATORY DATA:  His white count this morning was 5.4, hemoglobin 13.9, and  his platelets 241.  Sodium was 138, potassium 4.0, chloride 107, CO2 of 27,  glucose of 167, BUN 13, and creatinine 1.1.  His calcium was 8.4.  His  troponins have never risen above 0.02 since his admission, and his highest  CK total was 75.  His thyroid function test was somewhat puzzling.  His  first TSH was 0.16.  Repeat was 6.02 with a free T4 of 0.76.  These should  probably be repeated when the patient is in a more stable condition.  His  lipid panel was significant for an LDL of 83, triglycerides of 189, and a  total cholesterol of 143.  His HDL was depressed at 22.   FOLLOW UP:  The patient is to be followed by the [facility]  [facility] at [facility].     [name]   LC/MEDQ  D:  01/28/2004  T:  01/28/2004  Job:  [id number]

## 2010-06-05 NOTE — Procedures (Signed)
NAMEELGIE, MAZIARZ NO.:  [id number]   MEDICAL RECORD NO.:  [id number]          PATIENT TYPE:  INP   LOCATION:  A222                          FACILITY:  [facility]   PHYSICIAN:  Nicki Guadalajara, M.D.     DATE OF BIRTH:  [birth year]   DATE OF PROCEDURE:  01/27/2004  DATE OF DISCHARGE:                                  ECHOCARDIOGRAM   INDICATIONS:  This study was performed in this 76 year old gentleman with a  history of hypertension, diabetes mellitus, GERD with chest pain to evaluate  potential wall motional abnormality.   1.  Technically, this is an adequate M-mode 2-dimensional comprehensive      echo/Doppler study.  2.  There is mild left ventricular hypertrophy with being mildly asymmetric      with upper septal thickness measuring 1.5 cm and posterior wall      thickness measuring 1.1 cm. Left ventricular end-diastolic and end-      systolic dimensions were normal at 4.2 and 2.5 cm respectively. Systolic      function was normal. Estimated ejection fraction was at 55% to 60%.      There were no focal segmental wall motion abnormalities.  3.  The left atrium was mildly dilated at 4.3 cm.  4.  The right atrium was normal. The right ventricle was normal.  5.  Aortic root dimension was normal at 3.3 cm.  6.  Aortic valve was minimally thickened. The valve was trileaflet. There      was no evidence for stenosis or regurgitation.  7.  Mitral valve leaflets were relatively delicate. There was trace to mild      mitral regurgitation.  8.  Tricuspid valve was structurally normal.  9.  Pulmonic valve was normal.  10. There were no intramyocardial masses or thromboeffusions seen.   IMPRESSION:  Technically, this is not a good echo/Doppler study  demonstrating a normal left ventricular systolic function with mild slightly  asymmetric left ventricular hypertrophy without LVOT obstruction. There was  mild left atrial dilatation with trace to mild mitral  regurgitation.      TK/MEDQ  D:  01/27/2004  T:  01/27/2004  Job:  [id number]   cc:   Dr. Pixie Casino, M.D.  Fax: [phone]   Dani Gobble, MD  Fax: [phone]

## 2010-06-05 NOTE — H&P (Signed)
Christopher Fields, Fields             ACCOUNT NO.:  [id number]   MEDICAL RECORD NO.:  [id number]          PATIENT TYPE:  INP   LOCATION:  A222                          FACILITY:  [facility]   PHYSICIAN:  Christopher Fields, M.D.      DATE OF BIRTH:  1934-03-23   DATE OF ADMISSION:  01/25/2004  DATE OF DISCHARGE:  LH                                HISTORY & PHYSICAL   PRIMARY CARE PHYSICIAN:  Dr. Loleta Chance.   PRESENTING COMPLAINT:  Chest pain, shortness of breath.   HISTORY OF PRESENT ILLNESS:  This is a 76 year old white male with a history  of diabetes, hypertension, dyslipidemia, and GERD presenting with a two-day  history of intermittent chest pain.  The chest pain is described as heavy,  retrosternal, mainly occurring with no exertion.  The patient also describes  some diaphoresis but no radiation.  No aggravating or relieving factors.  The first episode started at 7:30 a.m. yesterday while the patient was  driving to work.  It lasted 5-10 minutes per the patient and then went away.  It subsequently returned yesterday where he was able to work for nine hours.  The chest pain then recurred this night and he was worried, hence he came to  the emergency room.  He denied any cough but has associated shortness of  breath with the chest pain.   PAST MEDICAL HISTORY:  1.  Diabetes type 2.  2.  Hypertension.  3.  Dyslipidemia.  4.  GERD.  5.  Obesity.   MEDICATIONS:  1.  Lovastatin 20 mg daily.  2.  Glyburide/Metformin 5/500 one to two tablets daily.  3.  Benazepril and hydrochlorothiazide 12.5 mg daily.  4.  Omeprazole 20 mg daily.   ALLERGIES:  The patient has no known drug allergies.   SOCIAL HISTORY:  The patient lives with his wife here in [place].  He has  four grown-up children, all of them healthy.  The patient has no tobacco or  alcohol history.  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  He drives far and near.   FAMILY HISTORY:  Significant for  angina, some hypertension mainly from his  Mom's side.   REVIEW OF SYSTEMS:  Essentially as in HPI.  GENERAL:  Denied any fever,  denied any weight gain or weight loss.  No dizziness.  RESPIRATORY:  As in  HPI.  No cough.  No chest pain.  CARDIOVASCULAR:  Denied any PND or  orthopnea or pedal swelling.  ABDOMEN:  Denied any abdominal pain, nausea,  vomiting, diarrhea, hematochezia, or hematemesis.  GU: Denied any urinary  symptoms.  EXTREMITIES:  The patient denied any joint swelling or pain or  musculoskeletal problems.   PHYSICAL EXAMINATION:  VITAL SIGNS:  Temperature, the patient is afebrile.  Had a Fields pressure of 131/61 lying pulse of 69, respiratory rate 18 with  sats 96% on room air.  GENERAL:  The patient is alert, oriented, not having any chest pain, and  very pleasant.  No acute distress.  HEENT:  PERRL.  EOMI.  NECK:  Supple.  No JVD.  No lymphadenopathy.  RESPIRATORY:  His chest is clear to auscultation bilaterally.  No wheezes,  no rales.  CARDIOVASCULAR:  S1 S2.  No murmurs.  No gallops.  ABDOMEN:  Obese, soft, nontender.  Positive bowel sounds.  EXTREMITIES:  No edema, cyanosis, or clubbing.   LABS:  Showed a white count of 5.4, hemoglobin 14.3, platelet count 257 with  a normal differential.  Sodium is 131, potassium 3.7, chloride 103, CO2 21,  glucose 187, BUN 14, creatinine 1.4, and calcium 8.6.  Initial cardiac  enzymes showed myoglobin 53.8, troponin less than 0.05.   EKG essentially shows a normal sinus rhythm with a rate of 59 with some left  axis deviation.  No ST-T wave changes.  Voltage seemed to be slightly low.   Chest x-ray shows no acute disease.   ASSESSMENT:  1.  This is a 76 year old white male with a history significant for      diabetes, hypertension, dyslipidemia, presenting with atypical chest      pain on and off which has since resolved.  The patient has all the risk      factors for cardiac disease.  Per the patient he had a stress  test      performed about three years ago that was normal.  However, with his risk      factors that include his hypertension, his diabetes, his dyslipidemia,      being a male, as well as family history, the patient is a high risk      patient for cardiac disease.  We will, therefore, admit him for 23 hour      observation, rule out myocardial infarction.  If everything turns out      normal, we will proceed with a Cardiolite or stress testing.  Other      possible differential for the patient's symptoms include      gastrointestinal which may be gastroesophageal reflux disease, or it      could be anxiety, it could be musculoskeletal but they are all less      likely.  2.  For her gastroesophageal reflux disease, I will maintain the patient on      some Protonix, omeprazole that he takes at home.  3.  Hypertension.  The patient's Fields pressure seems to be under good      control now.  I will keep him on his home medicine without a change.  4.  Diabetes.  We will check capillary Fields sugars q.a.c. and q.h.s.  Keep      the patient on his home medicine in addition also some sliding scale      insulin as needed.  5.  Obesity.  Discussed this patient's lifestyle modifications which the      patient has apparently been trying since he was diagnosed with diabetes.     Lawa   LG/MEDQ  D:  01/25/2004  T:  01/26/2004  Job:  [id number]

## 2010-06-05 NOTE — Discharge Summary (Signed)
NAMEKRYSTOPHER, KUENZEL NO.:  [id number]   MEDICAL RECORD NO.:  [id number]          PATIENT TYPE:  INP   LOCATION:  2901                         FACILITY:  [facility]   PHYSICIAN:  Madaline Savage, M.D.DATE OF BIRTH:  07/02/1934   DATE OF ADMISSION:  01/28/2004  DATE OF DISCHARGE:                                 DISCHARGE SUMMARY   ADDENDUM   Addendum to dictation [phone].   DISCHARGE MEDICATIONS:  1.  Plavix 75 mg one daily.  2.  Aspirin 81 mg one daily.  3.  Stop your lovastatin.  4.  Zocor 20 mg one at night.  5.  Niaspan 500 mg at night.  6.  Omeprazole 20 mg daily.  7.  Glyburide and metformin:  Do not take this today or tomorrow.  Restart      your previous dose starting February 01, 2004.  8.  Benazepril HCT as before.  9.  Nitroglycerin as directed.   DISCHARGE INSTRUCTIONS:  1.  No strenuous activity, lifting of greater than 5 pounds, driving, or      sexual activity for 3 days.  2.  Low-cholesterol diet.  3.  May gently wash groin site with warm water and soap.  4.  Call [phone] if any bleeding or increased discomfort in the groin site.  5.  Follow up with an exercise Cardiolite February 11, 2004, at 10:45 a.m.  6.  Follow up with Dr. Elsie Lincoln February 18, 2004, at 10:45 a.m.   ADDENDUM TO DISCHARGE DIAGNOSES:  1.  Elevated hemoglobin A1c this admission.  Needs followup with primary      care as an outpatient.  2.  Thyroid functions were checked this admission with different findings.      This will also need to be followed up with primary care as an      outpatient.       MBE/MEDQ  D:  01/30/2004  T:  01/30/2004  Job:  [id number]

## 2010-06-05 NOTE — Group Therapy Note (Signed)
Christopher Fields, BARTNIK NO.:  [id number]   MEDICAL RECORD NO.:  [id number]          PATIENT TYPE:  INP   LOCATION:  A222                          FACILITY:  [facility]   PHYSICIAN:  Vania Rea, M.D. DATE OF BIRTH:  [birth year]   DATE OF PROCEDURE:  01/27/2004  DATE OF DISCHARGE:                                   PROGRESS NOTE   SUBJECTIVE:  The patient says he continues to have postprandial chest pain  but has been maintained on bed rest.   OBJECTIVE:  VITAL SIGNS:  Temperature is 98, pulse 52, respirations 20,  blood pressure 109/61.  His fasting blood sugar was 84.  Pre-lunch blood  sugar 133.  CHEST:  Clear to auscultation bilaterally.  CARDIOVASCULAR SYSTEM:  Regular rhythm.  ABDOMEN:  Obese, soft, nontender.  EXTREMITIES:  Without edema.   LABS:  Cardiac enzymes : Normal; LDL 83;   ASSESSMENT:  1.  Acute coronary syndrome, awaiting cardiology input.  2.  Hypertension, controlled; continue ACEi  3.  Diabetes type 2, controlled; continue oral agents and ACEi.  4.  Hyperlipidemia; continue Zocor.     [name]   LC/MEDQ  D:  01/27/2004  T:  01/27/2004  Job:  [id number]

## 2010-06-05 NOTE — Cardiovascular Report (Signed)
NAMEJAHRED, TATAR NO.:  [id number]   MEDICAL RECORD NO.:  [id number]          PATIENT TYPE:  INP   LOCATION:  2901                         FACILITY:  [facility]   PHYSICIAN:  Madaline Savage, M.D.DATE OF BIRTH:  March 25, 1934   DATE OF PROCEDURE:  01/29/2004  DATE OF DISCHARGE:                              CARDIAC CATHETERIZATION   PROCEDURES PERFORMED:  1.  Selective coronary angiography by Judkins technique.  2.  Retrograde left heart catheterization.  3.  Left ventricular angiography.  4.  Abdominal aortography.  5.  Percutaneous coronary intervention of two vessels.      1.  Stenting of proximal left anterior descending.      2.  Cutting balloon angioplasty of ostial diagonal branch of left          anterior descending.   COMPLICATIONS:  None.   ENTRY SITE:  Right femoral dye used Omnipaque.   MEDICATIONS:  Medications given fentanyl IV for sedation, intravenous  heparin,  intravenous nitroglycerin and intravenous Integrilin as a double  bolus and then as a drip.   PATIENT PROFILE:  The patient is a 76 year old gentleman who was transferred  from [facility] for unstable angina with negative cardiac  enzymes and negative EKGs for ischemia.  Because of diabetes, hypertension,  hyperlipidemia and a classic stool referring angina pectoris, the patient  was transferred over for cardiac catheterization.  After catheterization was  performed and it was concluded that his culprit vessel giving rise to his  angina was his LAD and diagonal, we proceeded with a double vessel  percutaneous intervention with stents and cutting balloons and achieved an  excellent result.   HEMODYNAMIC DATA:  The left ventricular pressure was 120/9, end-diastolic  pressure 20, central aortic pressure 120/55, mean of 80.  No aortic valve  gradient by pullback technique.   ANGIOGRAPHIC RESULTS:  The patient's left main coronary artery was large in  diameter and  showed no lesions.   The left anterior descending coronary artery was tightly stenosed just  before the first septal perforator branch and adjacent to this 99% stenosis  was an area of 99% stenosis in the first diagonal branch.  The midportion of  the LAD and the distal LAD was normal and the second diagonal branch was  also normal.   The circumflex was nondominant.  It gave rise to a first obtuse marginal  branch and then a bifurcating second obtuse marginal branch and obtuse  marginal branches and all circumflex appeared normal.   Right coronary artery showed a dominant vessel large in caliber with a 30%  mid LAD area of irregularity and a 60-75 stenosis in the ostium of the  posterior descending branch of the distal RCA.   Left ventricular angiogram showed no evidence of any significant segmental  wall motion abnormalities.  Ejection fraction estimate was 55-60%.  No  mitral regurgitation was seen.   Abdominal aortography showed normal renal arteries bilaterally.  Normal  abdominal aorta and normal common iliacs.   The percutaneous coronary intervention of the two vessels indicated above  was accomplished with a 7-French  guide catheter, a 7-French introducer  sheath, intravenous Integrilin and intravenous heparin.  The guide catheter  used was a 7-French, left 3.5 guide catheter.  There were multiple guide  wires used during this case.  All medium support wires.  The predilatation  balloon to the diagonal is described below.  The LAD was direct stented.   The first task accomplished was intervention on the first diagonal.  This  was accomplished using a BMW guidewire advanced down the diagonal without a  lot of difficulty and thereafter, I placed a second guide wire which was  medium down the LAD.  The diagonal was then angioplastied with a 2.0 x 9.0  mm Maverick balloon with a good result.  I then resorted to a cutting  balloon that was 2.0 x 15 mm in diameter.  The result  showed that there was  marked improvement in the ostial stenosis of 99% down to about 30 and we  preserved TIMI III flow on the distal vessel.  There was no spasm or any  other problematic aspect to this intervention on the diagonal.   The LAD once wired was then stented with a 3.0 x 23 mm Cypher stent and I  left the diagonal wire in place and purposefully trapped that wire for  anticipated further needs a later.   A third guide wire was then position to tried to enter the ostium of the  diagonal after the LAD stent was deployed by going through the stent and  then into the diagonal.  This met with frustration and failure.   The stent once deployed with the stent balloon was then post dilated with a  3.5 x 20 mm Quantum Maverick deployed to 16 atmospheres of pressure  anticipating a lumen diameter of 3.6.  TIMI III distal flow was well  preserved in the lesion of 99% was successfully opened to 0% residual and  preservation of TIMI III distal flow in the LAD.   I removed the trapped wire in the diagonal and the wire down the LAD and I  went in with a BMW wire and went out the side branch of the diagonal through  the stent and was able to then put a Voyager balloon into the ostium of the  diagonal and inflated that balloon making sure that the balloon did extend  out into the LAD that with had been successfully stented.  After  approximately 8-10 atmospheres of pressure, I removed the balloon and found  that there was brisk flow in the diagonal and that the ostial stenosis  originally 90% was now only about 30%.  I did not feel inclined to try to  stent this ostial LAD because of the angulation which was basically 90  degrees off the LAD.   The procedure was a success.  No complications occurred.  The patient did  have transient ST-segment depressions in his V lead and he also had mild  chest pain,  but none of any major significance and his blood pressure remained fairly stable with  only transient drops in blood pressure during  balloon inflations.   The patient was recovered to the holding area on IV drips of heparin and  Integrilin and 300 mg of Plavix were given in divided doses before the  patient left the cath lab.  It is my anticipation giving 300 mg more of  Plavix at dinner time this evening totally 600 mg and we will continue  Integrilin for 18 hours.  FINAL DIAGNOSES:  1.  Two-vessel coronary artery disease.      1.  99% proximal left anterior descending stenosis.      2.  99% ostial diagonal stenosis of the first diagonal.      3.  60-70% ostial posterior descending artery stenosis.      4.  30% mid right coronary artery stenosis.  2.  Normal left ventricular systolic function.  3.  Normal abdominal aorta.  4.  Normal renal arteries.   PLAN:  Plavix for 6 months.  Outpatient Cardiolite to determine the  significance of the posterior descending lesion.  The patient will be  tentatively discharged tomorrow with in 24 hours if all goes well.       WHG/MEDQ  D:  01/29/2004  T:  01/29/2004  Job:  [id number]   cc:   [facility]  [place], [place], c/o Dr. Artis Delay   [facility]  [place], [place]   Dani Gobble, MD  Fax: [phone]   [facility]

## 2010-12-18 ENCOUNTER — Other Ambulatory Visit (HOSPITAL_COMMUNITY): Payer: Self-pay | Admitting: Family Medicine

## 2010-12-18 ENCOUNTER — Ambulatory Visit (HOSPITAL_COMMUNITY)
Admission: RE | Admit: 2010-12-18 | Discharge: 2010-12-18 | Disposition: A | Discharge status: Home / Self Care | Payer: Medicare Other | Source: Ambulatory Visit | Attending: Family Medicine | Admitting: Family Medicine

## 2010-12-18 DIAGNOSIS — R05 Cough: Secondary | ICD-10-CM | POA: Insufficient documentation

## 2010-12-18 DIAGNOSIS — J40 Bronchitis, not specified as acute or chronic: Secondary | ICD-10-CM | POA: Insufficient documentation

## 2010-12-18 DIAGNOSIS — R059 Cough, unspecified: Secondary | ICD-10-CM | POA: Insufficient documentation

## 2010-12-27 ENCOUNTER — Encounter: Payer: Self-pay | Admitting: *Deleted

## 2010-12-27 ENCOUNTER — Emergency Department (HOSPITAL_COMMUNITY)
Admission: EM | Admit: 2010-12-27 | Discharge: 2010-12-27 | Disposition: A | Discharge status: Home / Self Care | Payer: Medicare Other | Attending: Emergency Medicine | Admitting: Emergency Medicine

## 2010-12-27 DIAGNOSIS — E119 Type 2 diabetes mellitus without complications: Secondary | ICD-10-CM | POA: Insufficient documentation

## 2010-12-27 DIAGNOSIS — I10 Essential (primary) hypertension: Secondary | ICD-10-CM | POA: Insufficient documentation

## 2010-12-27 DIAGNOSIS — T7840XA Allergy, unspecified, initial encounter: Secondary | ICD-10-CM

## 2010-12-27 DIAGNOSIS — Z87891 Personal history of nicotine dependence: Secondary | ICD-10-CM | POA: Insufficient documentation

## 2010-12-27 DIAGNOSIS — T360X5A Adverse effect of penicillins, initial encounter: Secondary | ICD-10-CM | POA: Insufficient documentation

## 2010-12-27 DIAGNOSIS — L500 Allergic urticaria: Secondary | ICD-10-CM | POA: Insufficient documentation

## 2010-12-27 HISTORY — DX: Essential (primary) hypertension: I10

## 2010-12-27 MED ORDER — FAMOTIDINE 20 MG PO TABS: 40.0000 mg | ORAL_TABLET | Freq: Two times a day (BID) | ORAL | Status: DC

## 2010-12-27 MED ORDER — PREDNISONE 50 MG PO TABS: 50.0000 mg | ORAL_TABLET | Freq: Every day | ORAL | Status: DC

## 2010-12-27 MED ORDER — DEXAMETHASONE SODIUM PHOSPHATE 4 MG/ML IJ SOLN
8.0000 mg | Freq: Once | INTRAMUSCULAR | Status: AC
  Administered 2010-12-27: 8 mg via INTRAMUSCULAR
  Filled 2010-12-27: qty 2

## 2010-12-27 MED ORDER — FAMOTIDINE 20 MG PO TABS
20.0000 mg | ORAL_TABLET | Freq: Once | ORAL | Status: AC
  Administered 2010-12-27: 20 mg via ORAL
  Filled 2010-12-27: qty 1

## 2010-12-27 MED ORDER — LORATADINE 10 MG PO TABS
10.0000 mg | ORAL_TABLET | Freq: Once | ORAL | Status: AC
  Administered 2010-12-27: 10 mg via ORAL
  Filled 2010-12-27: qty 1

## 2010-12-27 NOTE — ED Notes (Signed)
Pt c/o of rash on back and abdomen.

## 2010-12-27 NOTE — ED Provider Notes (Addendum)
History     CSN: [id number] Arrival date & time: 12/27/2010  2:50 AM   First MD Initiated Contact with Patient 12/27/10 0254      Chief Complaint  Patient presents with  . Allergic Reaction  . Rash    (Consider location/radiation/quality/duration/timing/severity/associated sxs/prior treatment) Patient is a 76 y.o. male presenting with allergic reaction and rash. The history is provided by the patient. No language interpreter was used.  Allergic Reaction The primary symptoms are  rash and urticaria. The primary symptoms do not include wheezing, shortness of breath, cough, abdominal pain, nausea, vomiting, diarrhea, dizziness, palpitations, altered mental status or angioedema. The current episode started 3 to 5 hours ago. The problem has not changed since onset.This is a new problem.  The rash is associated with itching.  The urticaria began 3 to 5 hours ago. The urticaria has been unchanged since its onset. Urticaria is a new problem. Urticaria is located on the abdomen. The onset of urticaria was associated with scratching of the skin.  The onset of the reaction was associated with a new medication. Significant symptoms also include itching. Significant symptoms that are not present include eye redness, flushing or rhinorrhea.  Rash  Associated symptoms include itching.    Past Medical History  Diagnosis Date  . Diabetes mellitus   . Hypertension     History reviewed. No pertinent past surgical history.  History reviewed. No pertinent family history.  History  Substance Use Topics  . Smoking status: Former Games developer  . Smokeless tobacco: Not on file  . Alcohol Use: No      Review of Systems  Constitutional: Negative for activity change.  HENT: Negative for rhinorrhea, neck pain and ear discharge.   Eyes: Negative for redness.  Respiratory: Negative for cough, shortness of breath and wheezing.   Cardiovascular: Negative for palpitations.  Gastrointestinal: Negative for  nausea, vomiting, abdominal pain, diarrhea and abdominal distention.  Genitourinary: Negative for dysuria and difficulty urinating.  Musculoskeletal: Negative for arthralgias.  Skin: Positive for itching and rash. Negative for flushing.  Neurological: Negative for dizziness.  Hematological: Negative.   Psychiatric/Behavioral: Negative.  Negative for altered mental status.    Allergies  Review of patient's allergies indicates no known allergies.  Home Medications   Current Outpatient Rx  Name Route Sig Dispense Refill  . AMOXICILLIN-POT CLAVULANATE 500-125 MG PO TABS Oral Take 1 tablet by mouth 3 (three) times daily.      Marland Kitchen BENAZEPRIL-HYDROCHLOROTHIAZIDE 10-12.5 MG PO TABS Oral Take 1 tablet by mouth daily.      Marland Kitchen CLOPIDOGREL BISULFATE 75 MG PO TABS Oral Take 75 mg by mouth daily.      Marland Kitchen METFORMIN HCL ER (MOD) 500 MG PO TB24 Oral Take 500 mg by mouth daily with breakfast.      . METOPROLOL TARTRATE 50 MG PO TABS Oral Take 50 mg by mouth 2 (two) times daily.      Marland Kitchen PANTOPRAZOLE SODIUM 40 MG PO TBEC Oral Take 40 mg by mouth daily.      Marland Kitchen SIMVASTATIN 40 MG PO TABS Oral Take 40 mg by mouth at bedtime.        BP 133/67  Pulse 61  Temp(Src) 97.6 F (36.4 C) (Oral)  Resp 20  Ht 5\' 3"  (1.6 m)  Wt 160 lb (72.576 kg)  BMI 28.34 kg/m2  SpO2 100%  Physical Exam  Constitutional: He is oriented to person, place, and time. He appears well-developed and well-nourished. No distress.  HENT:  Head: Normocephalic  and atraumatic.  Mouth/Throat: Oropharynx is clear and moist.       No swelling of the lips tongue or uvula  Eyes: EOM are normal. Pupils are equal, round, and reactive to light.  Neck: Normal range of motion. Neck supple.  Cardiovascular: Normal rate and regular rhythm.   Pulmonary/Chest: Effort normal and breath sounds normal. No stridor.  Abdominal: Soft. Bowel sounds are normal. There is no tenderness.  Musculoskeletal: Normal range of motion.  Neurological: He is alert and  oriented to person, place, and time.  Skin: Skin is warm and dry. Rash noted. Rash is urticarial. He is not diaphoretic.     Psychiatric: Thought content normal.    ED Course  Procedures (including critical care time)  Labs Reviewed - No data to display No results found.   No diagnosis found.    MDM  Stop taking the augmentin.  Call your PMD and tell him you have had an allergic reaction.  List amoxicillin as an allergy.  Return to the ED immediately for shortness of breath, tongue lip swelling feeling the throat is swelling or any concerns. Patient verbalizes understanding and agrees to follow up        Tanaysha Alkins K Cyrstal Leitz-Rasch, MD 12/27/10 4540  Jasmine Awe, MD 12/27/10 [phone]

## 2010-12-29 ENCOUNTER — Encounter: Payer: Self-pay | Admitting: Internal Medicine

## 2010-12-31 ENCOUNTER — Ambulatory Visit (INDEPENDENT_AMBULATORY_CARE_PROVIDER_SITE_OTHER): Payer: Medicare Other | Admitting: Gastroenterology

## 2010-12-31 ENCOUNTER — Encounter: Payer: Self-pay | Admitting: Gastroenterology

## 2010-12-31 DIAGNOSIS — Z800 Family history of malignant neoplasm of digestive organs: Secondary | ICD-10-CM

## 2010-12-31 DIAGNOSIS — K219 Gastro-esophageal reflux disease without esophagitis: Secondary | ICD-10-CM

## 2010-12-31 DIAGNOSIS — R1310 Dysphagia, unspecified: Secondary | ICD-10-CM

## 2010-12-31 NOTE — Patient Instructions (Addendum)
Continue taking Protonix daily. Make sure you take small bites, chew well, sit upright for at least an hour after eating. Do not eat late at night. Have your last meal about 3 hours before you go to bed.  We have set you up for a look at your esophagus and stomach by an endoscopy with Dr. Jena Gauss. Further recommendations to follow once this is completed.   Do not take any diabetes medication the morning of the procedure.

## 2010-12-31 NOTE — Progress Notes (Signed)
Referring Provider: Milana Obey, MD Primary Care Physician:  Milana Obey, MD Primary Gastroenterologist:  Dr. Jena Gauss  Chief Complaint  Patient presents with  . Gastrophageal Reflux    HPI:   Christopher Fields is a 76 year old male referred by Dr. Sudie Bailey secondary to dysphagia and cough with eating. He is known to [place] from a prior colonoscopy in 2008 with normal findings. Prior to this he had a colonoscopy elsewhere with a polyp removed, path unknown. He has a first-degree relative who was diagnosed with colon cancer and deceased at age 65. Therefore, he will be due for surveillance colonoscopy in June 2013. He presents today with intermittent dysphagia and symptoms of coughing spells about 5-10 minutes after starting to eat. He is not regurgitating any food, nor is he vomiting. Denies nausea, abdominal pain, melena, hematochezia, change in bowel habits, unintentional wt loss or lack of appetite. He has been on Protonix X 1 year. He also reports nocturnal coughing.   He has been treated for possible low grade aspiration pneumonia by Dr. Sudie Bailey.    Past Medical History  Diagnosis Date  . Diabetes mellitus   . Hypertension   . GERD (gastroesophageal reflux disease)     Past Surgical History  Procedure Date  . Colonoscopy 07/01/06    normal colon colonic mucosa appeared normal  . Coronary angioplasty with stent placement     Current Outpatient Prescriptions  Medication Sig Dispense Refill  . aspirin 81 MG tablet Take 81 mg by mouth daily.        . benazepril-hydrochlorthiazide (LOTENSIN HCT) 10-12.5 MG per tablet Take 1 tablet by mouth daily.        . clopidogrel (PLAVIX) 75 MG tablet Take 75 mg by mouth daily.        . metFORMIN (GLUMETZA) 500 MG (MOD) 24 hr tablet Take 500 mg by mouth daily with breakfast.        . metoprolol (LOPRESSOR) 50 MG tablet Take 50 mg by mouth 2 (two) times daily.        . pantoprazole (PROTONIX) 40 MG tablet Take 40 mg by mouth daily.         . predniSONE (DELTASONE) 10 MG tablet Take 10 mg by mouth daily.       . predniSONE (DELTASONE) 50 MG tablet Take 1 tablet (50 mg total) by mouth daily.  5 tablet  0  . simvastatin (ZOCOR) 40 MG tablet Take 40 mg by mouth at bedtime.          Allergies as of 12/31/2010 - Review Complete 12/31/2010  Allergen Reaction Noted  . Penicillins Itching and Rash 12/31/2010    Family History  Problem Relation Age of Onset  . Colon cancer Sister     deceased age 34    History   Social History  . Marital Status: Married    Spouse Name: N/A    Number of Children: N/A  . Years of Education: N/A   Occupational History  . Not on file.   Social History Main Topics  . Smoking status: Former Smoker -- 0.5 packs/day    Types: Cigarettes  . Smokeless tobacco: Not on file   Comment: as teenager  . Alcohol Use: No  . Drug Use: No  . Sexually Active: Not on file   Other Topics Concern  . Not on file   Social History Narrative  . No narrative on file    Review of Systems: Gen: Denies any fever, chills, loss of appetite, fatigue,  weight loss. CV: Denies chest pain, heart palpitations, syncope, peripheral edema. Resp: Denies shortness of breath with rest, cough, wheezing GI: SEE HPI GU : Denies urinary burning, urinary frequency, urinary incontinence.  MS: Denies joint pain, muscle weakness, cramps, limited movement Derm: Denies rash, itching, dry skin Psych: Denies depression, anxiety, confusion or memory loss  Heme: Denies bruising, bleeding, and enlarged lymph nodes.  Physical Exam: BP 123/60  Pulse 51  Temp(Src) 97.9 F (36.6 C) (Temporal)  Ht 5\' 3"  (1.6 m)  Wt 165 lb 3.2 oz (74.934 kg)  BMI 29.26 kg/m2 General:   Alert and oriented. Well-developed, well-nourished, pleasant and cooperative. Head:  Normocephalic and atraumatic. Eyes:  Conjunctiva pink, sclera clear, no icterus.    Ears:  Normal auditory acuity. Nose:  No deformity, discharge,  or lesions. Mouth:  No  deformity or lesions, mucosa pink and moist.  Neck:  Supple, without mass or thyromegaly. Lungs:  Clear to auscultation bilaterally, without wheezing, rales, or rhonchi.  Heart:  S1, S2 present without murmurs noted.  Abdomen:  +BS, soft, non-tender and non-distended. Large AP diameter. Umbilical hernia noted, easily reducible.  Without mass or HSM. No rebound or guarding.  Rectal:  Deferred  Msk:  Symmetrical without gross deformities. Normal posture. Extremities:  Without clubbing or edema. Neurologic:  Alert and  oriented x4;  grossly normal neurologically. Skin:  Intact, warm and dry without significant lesions or rashes Cervical Nodes:  No significant cervical adenopathy. Psych:  Alert and cooperative. Normal mood and affect.

## 2011-01-01 DIAGNOSIS — R1310 Dysphagia, unspecified: Secondary | ICD-10-CM | POA: Insufficient documentation

## 2011-01-01 DIAGNOSIS — Z800 Family history of malignant neoplasm of digestive organs: Secondary | ICD-10-CM | POA: Insufficient documentation

## 2011-01-01 NOTE — Assessment & Plan Note (Addendum)
76 year old male with new-onset dysphagia as well as coughing spells occuring 5-10 minutes after start of each meal. Reports nocturnal coughing. Denies N/V, abdominal pain, wt loss, lack of appetite. Denies melena. He has been empirically treated for possible aspiration pneumonia.  At this point, he will need investigation with an EGD to assess for possible esophageal web, ring, or stricture that could be contributing to dysphagia. However, he may also be intermittently aspirating and may need an UGI or possible speech evaluation after EGD is performed. He was informed of this. He has been on Protonix X 1 year.  1. Continue Protonix 2. Eat small meals, chew well, sit upright after eating. Avoid eating late at night 3. No oral DM meds day of procedure.  4. Proceed with upper endoscopy in the near future with Dr. Jena Gauss. The risks, benefits, and alternatives have been discussed in detail with patient. They have stated understanding and desire to proceed.  5. Consider UGI or speech evaluation if necessary

## 2011-01-01 NOTE — Progress Notes (Signed)
Cc to PCP 

## 2011-01-01 NOTE — Assessment & Plan Note (Signed)
Personal hx of polyps and first degree relative with colon cancer diagnosed in her 75s. Due for surveillance colonoscopy Aug 2013. No concerns or worrisome signs at this point. Proceed with colonoscopy in Aug 2013.

## 2011-01-14 ENCOUNTER — Encounter (HOSPITAL_COMMUNITY): Payer: Self-pay | Admitting: Pharmacy Technician

## 2011-01-20 MED ORDER — SODIUM CHLORIDE 0.45 % IV SOLN
Freq: Once | INTRAVENOUS | Status: AC
  Administered 2011-01-21: 08:00:00 via INTRAVENOUS

## 2011-01-21 ENCOUNTER — Ambulatory Visit (HOSPITAL_COMMUNITY)
Admission: RE | Admit: 2011-01-21 | Discharge: 2011-01-21 | Disposition: A | Discharge status: Home / Self Care | Payer: Medicare Other | Source: Ambulatory Visit | Attending: Internal Medicine | Admitting: Internal Medicine

## 2011-01-21 ENCOUNTER — Encounter (HOSPITAL_COMMUNITY): Payer: Self-pay | Admitting: *Deleted

## 2011-01-21 ENCOUNTER — Encounter (HOSPITAL_COMMUNITY)
Admission: RE | Disposition: A | Discharge status: Home / Self Care | Payer: Self-pay | Source: Ambulatory Visit | Attending: Internal Medicine

## 2011-01-21 ENCOUNTER — Other Ambulatory Visit: Payer: Self-pay | Admitting: Internal Medicine

## 2011-01-21 DIAGNOSIS — K219 Gastro-esophageal reflux disease without esophagitis: Secondary | ICD-10-CM

## 2011-01-21 DIAGNOSIS — Z01812 Encounter for preprocedural laboratory examination: Secondary | ICD-10-CM | POA: Insufficient documentation

## 2011-01-21 DIAGNOSIS — I10 Essential (primary) hypertension: Secondary | ICD-10-CM | POA: Insufficient documentation

## 2011-01-21 DIAGNOSIS — R1310 Dysphagia, unspecified: Secondary | ICD-10-CM | POA: Insufficient documentation

## 2011-01-21 DIAGNOSIS — E119 Type 2 diabetes mellitus without complications: Secondary | ICD-10-CM | POA: Insufficient documentation

## 2011-01-21 DIAGNOSIS — K222 Esophageal obstruction: Secondary | ICD-10-CM | POA: Insufficient documentation

## 2011-01-21 DIAGNOSIS — K2960 Other gastritis without bleeding: Secondary | ICD-10-CM

## 2011-01-21 DIAGNOSIS — Z79899 Other long term (current) drug therapy: Secondary | ICD-10-CM | POA: Insufficient documentation

## 2011-01-21 DIAGNOSIS — Z7982 Long term (current) use of aspirin: Secondary | ICD-10-CM | POA: Insufficient documentation

## 2011-01-21 DIAGNOSIS — K319 Disease of stomach and duodenum, unspecified: Secondary | ICD-10-CM | POA: Insufficient documentation

## 2011-01-21 HISTORY — PX: ESOPHAGOGASTRODUODENOSCOPY: SHX1529

## 2011-01-21 SURGERY — ESOPHAGOGASTRODUODENOSCOPY (EGD) WITH ESOPHAGEAL DILATION
Anesthesia: Moderate Sedation

## 2011-01-21 MED ORDER — MIDAZOLAM HCL 5 MG/5ML IJ SOLN
INTRAMUSCULAR | Status: DC | PRN
  Administered 2011-01-21: 2 mg via INTRAVENOUS

## 2011-01-21 MED ORDER — BUTAMBEN-TETRACAINE-BENZOCAINE 2-2-14 % EX AERO
INHALATION_SPRAY | CUTANEOUS | Status: DC | PRN
  Administered 2011-01-21: 2 via TOPICAL

## 2011-01-21 MED ORDER — MEPERIDINE HCL 100 MG/ML IJ SOLN
INTRAMUSCULAR | Status: DC | PRN
  Administered 2011-01-21: 50 mg via INTRAVENOUS

## 2011-01-21 MED ORDER — MEPERIDINE HCL 100 MG/ML IJ SOLN
INTRAMUSCULAR | Status: AC
  Filled 2011-01-21: qty 2

## 2011-01-21 MED ORDER — MIDAZOLAM HCL 5 MG/5ML IJ SOLN
INTRAMUSCULAR | Status: AC
  Filled 2011-01-21: qty 10

## 2011-01-21 NOTE — Op Note (Signed)
[facility] [address] [place], [place]  [address]  ENDOSCOPY PROCEDURE REPORT  PATIENT:  [name], Christopher  MR#:  [id number] BIRTHDATE:  31-Aug-1934, 76 yrs. old  GENDER:  male  ENDOSCOPIST:  R. Roetta Sessions, MD FACP [facility] Referred by:          Dr. Sudie Bailey  PROCEDURE DATE:  01/21/2011 PROCEDURE:  EGD with Elease Hashimoto dilation followed by  biopsy  INDICATIONS:  esophageal dysphagia.  INFORMED CONSENT:   The risks, benefits, limitations, alternatives and imponderables have been discussed.  The potential for biopsy, esophogeal dilation, etc. have also been reviewed.  Questions have been answered.  All parties agreeable.  Please see the history and physical in the medical record for more information.  MEDICATIONS:      Versed 2 mg IV and Demerol 50 mg IV and a single dose. Cetacaine spray  DESCRIPTION OF PROCEDURE:   The XB-[id number] ([id number]) endoscope was introduced through the mouth and advanced to the second portion of the duodenum without difficulty or limitations.  The mucosal surfaces were surveyed very carefully during advancement of the scope and upon withdrawal.  Retroflexion view of the proximal stomach and esophagogastric junction was performed.  <<PROCEDUREIMAGES>>  FINDINGS:  Patent tubular esophagus. Normal appearing mucosa. Small hiatal hernia. Multiple antral erosions. No ulcer or infiltrating     process. The first and second portion of the duodenum appeared normal  THERAPEUTIC / DIAGNOSTIC MANEUVERS PERFORMED:    . A 56 French Maloney dilator was passed to full insertion easily. A look back revealed an occult cervical esophageal ring had been been rupturee. No apparent complication.  COMPLICATIONS:   None  IMPRESSION:   Hiatal hernia. 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 Gastric erosions-status post biopsy  RECOMMENDATIONS:    Continue daily acid suppression therapy. Follow up on pathology  ______________________________ R.  Roetta Sessions, MD Caleen Essex  CC:  Gareth Morgan MD  n. eSIGNED:   R. Roetta Sessions at 01/21/2011 08:15 AM  Christopher [name], [phone]

## 2011-01-21 NOTE — H&P (Signed)
  I have seen & examined the patient prior to the procedure(s) today and reviewed the history and physical/consultation.  There have been no changes.  After consideration of the risks, benefits, alternatives and imponderables, the patient has consented to the procedure(s).   

## 2011-06-16 ENCOUNTER — Encounter: Payer: Self-pay | Admitting: Internal Medicine

## 2013-04-17 ENCOUNTER — Encounter (INDEPENDENT_AMBULATORY_CARE_PROVIDER_SITE_OTHER): Payer: Self-pay

## 2013-04-17 ENCOUNTER — Encounter: Payer: Self-pay | Admitting: Gastroenterology

## 2013-04-17 ENCOUNTER — Ambulatory Visit (INDEPENDENT_AMBULATORY_CARE_PROVIDER_SITE_OTHER): Payer: Medicare Other | Admitting: Gastroenterology

## 2013-04-17 VITALS — BP 119/64 | HR 63 | Temp 97.8°F | Ht 63.0 in | Wt 162.0 lb

## 2013-04-17 DIAGNOSIS — Z800 Family history of malignant neoplasm of digestive organs: Secondary | ICD-10-CM

## 2013-04-17 DIAGNOSIS — R1310 Dysphagia, unspecified: Secondary | ICD-10-CM

## 2013-04-17 DIAGNOSIS — K219 Gastro-esophageal reflux disease without esophagitis: Secondary | ICD-10-CM

## 2013-04-17 DIAGNOSIS — R1314 Dysphagia, pharyngoesophageal phase: Secondary | ICD-10-CM

## 2013-04-17 DIAGNOSIS — Z860100 Personal history of colon polyps, unspecified: Secondary | ICD-10-CM | POA: Insufficient documentation

## 2013-04-17 DIAGNOSIS — R1319 Other dysphagia: Secondary | ICD-10-CM

## 2013-04-17 NOTE — Assessment & Plan Note (Signed)
79 year old gentleman with history of cervical esophageal web status post dilation back in 2013 who presents with recurrent esophageal dysphagia. Predominantly having problems with solid foods and large pills. Suspect recurrent web, ring, stricture. Typical acid reflux symptoms well controlled on pantoprazole. EGD with esophageal dilation near future.  I have discussed the risks, alternatives, benefits with regards to but not limited to the risk of reaction to medication, bleeding, infection, perforation and the patient is agreeable to proceed. Written consent to be obtained.  In the meantime he would chew his food thoroughly. Use plenty of  liquids while eating and taking medication. Sit upright for 30 minutes after taking medication.

## 2013-04-17 NOTE — Assessment & Plan Note (Signed)
[place] history of colon polyps in first-degree relative with colon cancer diagnosed in 5160s. He was due for surveillance colonoscopy in August of 2013. At this time he would like to postpone until summertime. He requests that we make him a followup appointment in June.

## 2013-04-17 NOTE — Patient Instructions (Signed)
1. Upper endoscopy with esophageal dilation with Dr. Jena Gaussourk. See separate instructions.  2. We will see you back in 06/2013 to schedule your colonoscopy at that time as requested today.

## 2013-04-17 NOTE — Progress Notes (Signed)
cc'd to pcp 

## 2013-04-17 NOTE — Progress Notes (Signed)
Primary Care Physician:  Milana ObeyKNOWLTON,STEPHEN D, MD  Primary Gastroenterologist:  Roetta SessionsMichael Rourk, MD   Chief Complaint  Patient presents with  . Dysphagia    HPI:  Christopher Fields is a 79 y.o. male here for further evaluation of esophageal dysphagia. He was last seen in January 2013 for the same symptoms. He had an EGD which revealed a cervical esophageal web and antral erosions (no H. pylori). He underwent a 4356 French Maloney dilator at that time.  Her the past couple months he's noted increasing difficulty swallowing. Food stops in the upper esophagus. He feels like his breath is cut off and he begins getting blurry vision.  Lasts for few seconds. Happens with peanut and mayonnaise sandwiches. No heartburn on protonix. Has problems swallowing bigger pills. Denies odynophagia. BM regular. No melena, brbpr, abdominal pain, n/v. No weight loss.   Current Outpatient Prescriptions  Medication Sig Dispense Refill  . aspirin 81 MG tablet Take 81 mg by mouth daily.        . benazepril-hydrochlorthiazide (LOTENSIN HCT) 10-12.5 MG per tablet Take 1 tablet by mouth daily.        . clopidogrel (PLAVIX) 75 MG tablet Take 75 mg by mouth daily.        Marland Kitchen. gabapentin (NEURONTIN) 100 MG capsule Take 100 mg by mouth 2 (two) times daily.       Marland Kitchen. glipiZIDE (GLUCOTROL) 5 MG tablet Take 5 mg by mouth daily before breakfast.       . metFORMIN (GLUMETZA) 500 MG (MOD) 24 hr tablet Take 500 mg by mouth daily with breakfast.        . metoprolol (LOPRESSOR) 50 MG tablet Take 50 mg by mouth 2 (two) times daily.        . pantoprazole (PROTONIX) 40 MG tablet Take 40 mg by mouth 2 (two) times daily.       . simvastatin (ZOCOR) 40 MG tablet Take 40 mg by mouth at bedtime.         No current facility-administered medications for this visit.    Allergies as of 04/17/2013 - Review Complete 04/17/2013  Allergen Reaction Noted  . Penicillins Itching and Rash 12/31/2010    Past Medical History  Diagnosis Date  . Diabetes  mellitus   . Hypertension   . GERD (gastroesophageal reflux disease)     Past Surgical History  Procedure Laterality Date  . Colonoscopy  07/01/06    normal colon colonic mucosa appeared normal  . Coronary angioplasty with stent placement    . Esophagogastroduodenoscopy  01/21/2011    RMR: Hiatal hernia. 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 Gastric erosions-status post biopsy (no h.pyori)    Family History  Problem Relation Age of Onset  . Colon cancer Sister     deceased age 77    History   Social History  . Marital Status: Married    Spouse Name: N/A    Number of Children: N/A  . Years of Education: N/A   Occupational History  . Not on file.   Social History Main Topics  . Smoking status: Former Smoker -- 0.50 packs/day    Types: Cigarettes  . Smokeless tobacco: Not on file     Comment: as teenager  . Alcohol Use: No  . Drug Use: No  . Sexual Activity: Not on file   Other Topics Concern  . Not on file   Social History Narrative  . No narrative on file      ROS:  General: Negative  for anorexia, weight loss, fever, chills, fatigue, weakness. Eyes: Negative for vision changes.  ENT: Negative for hoarseness, difficulty swallowing , nasal congestion. CV: Negative for chest pain, angina, palpitations, dyspnea on exertion, peripheral edema.  Respiratory: Negative for dyspnea at rest, dyspnea on exertion, cough, sputum, wheezing.  GI: See history of present illness. GU:  Negative for dysuria, hematuria, urinary incontinence, urinary frequency, nocturnal urination.  MS: Negative for joint pain, low back pain.  Derm: Negative for rash or itching.  Neuro: Negative for weakness, abnormal sensation, seizure, frequent headaches, memory loss, confusion.  Psych: Negative for anxiety, depression, suicidal ideation, hallucinations.  Endo: Negative for unusual weight change.  Heme: Negative for bruising or bleeding. Allergy: Negative for rash or hives.     Physical Examination:  BP 119/64  Pulse 63  Temp(Src) 97.8 F (36.6 C) (Oral)  Ht 5\' 3"  (1.6 m)  Wt 162 lb (73.483 kg)  BMI 28.70 kg/m2   General: Well-nourished, well-developed in no acute distress.  Head: Normocephalic, atraumatic.   Eyes: Conjunctiva pink, no icterus. Mouth: Oropharyngeal mucosa moist and pink , no lesions erythema or exudate. Neck: Supple without thyromegaly, masses, or lymphadenopathy.  Lungs: Clear to auscultation bilaterally.  Heart: Regular rate and rhythm, no murmurs rubs or gallops.  Abdomen: Bowel sounds are normal, nontender, nondistended, no hepatosplenomegaly or masses, no abdominal bruits or    hernia , no rebound or guarding.   Rectal: Not performed Extremities: No lower extremity edema. No clubbing or deformities.  Neuro: Alert and oriented x 4 , grossly normal neurologically.  Skin: Warm and dry, no rash or jaundice.   Psych: Alert and cooperative, normal mood and affect.

## 2013-04-24 ENCOUNTER — Encounter (HOSPITAL_COMMUNITY): Payer: Self-pay | Admitting: Pharmacy Technician

## 2013-05-02 ENCOUNTER — Encounter (HOSPITAL_COMMUNITY)
Admission: RE | Disposition: A | Discharge status: Home / Self Care | Payer: Self-pay | Source: Ambulatory Visit | Attending: Internal Medicine

## 2013-05-02 ENCOUNTER — Encounter (HOSPITAL_COMMUNITY): Payer: Self-pay

## 2013-05-02 ENCOUNTER — Ambulatory Visit (HOSPITAL_COMMUNITY)
Admission: RE | Admit: 2013-05-02 | Discharge: 2013-05-02 | Disposition: A | Discharge status: Home / Self Care | Payer: Medicare Other | Source: Ambulatory Visit | Attending: Internal Medicine | Admitting: Internal Medicine

## 2013-05-02 ENCOUNTER — Other Ambulatory Visit: Payer: Self-pay

## 2013-05-02 DIAGNOSIS — Q393 Congenital stenosis and stricture of esophagus: Secondary | ICD-10-CM

## 2013-05-02 DIAGNOSIS — Q394 Esophageal web: Secondary | ICD-10-CM

## 2013-05-02 DIAGNOSIS — R1319 Other dysphagia: Secondary | ICD-10-CM

## 2013-05-02 DIAGNOSIS — K449 Diaphragmatic hernia without obstruction or gangrene: Secondary | ICD-10-CM | POA: Insufficient documentation

## 2013-05-02 DIAGNOSIS — I498 Other specified cardiac arrhythmias: Secondary | ICD-10-CM | POA: Insufficient documentation

## 2013-05-02 DIAGNOSIS — K219 Gastro-esophageal reflux disease without esophagitis: Secondary | ICD-10-CM

## 2013-05-02 DIAGNOSIS — R1314 Dysphagia, pharyngoesophageal phase: Secondary | ICD-10-CM

## 2013-05-02 DIAGNOSIS — R1310 Dysphagia, unspecified: Secondary | ICD-10-CM | POA: Insufficient documentation

## 2013-05-02 DIAGNOSIS — Q391 Atresia of esophagus with tracheo-esophageal fistula: Secondary | ICD-10-CM

## 2013-05-02 HISTORY — PX: ESOPHAGOGASTRODUODENOSCOPY (EGD) WITH ESOPHAGEAL DILATION: SHX5812

## 2013-05-02 LAB — GLUCOSE, CAPILLARY: Glucose-Capillary: 147 mg/dL — ABNORMAL HIGH (ref 70–99)

## 2013-05-02 SURGERY — ESOPHAGOGASTRODUODENOSCOPY (EGD) WITH ESOPHAGEAL DILATION
Anesthesia: Moderate Sedation

## 2013-05-02 MED ORDER — ONDANSETRON HCL 4 MG/2ML IJ SOLN
INTRAMUSCULAR | Status: AC
  Filled 2013-05-02: qty 2

## 2013-05-02 MED ORDER — STERILE WATER FOR IRRIGATION IR SOLN
Status: DC | PRN
  Administered 2013-05-02: 11:00:00

## 2013-05-02 MED ORDER — LIDOCAINE VISCOUS 2 % MT SOLN
OROMUCOSAL | Status: AC
  Filled 2013-05-02: qty 15

## 2013-05-02 MED ORDER — MEPERIDINE HCL 100 MG/ML IJ SOLN
INTRAMUSCULAR | Status: DC | PRN
  Administered 2013-05-02: 50 mg via INTRAVENOUS

## 2013-05-02 MED ORDER — MIDAZOLAM HCL 5 MG/5ML IJ SOLN
INTRAMUSCULAR | Status: AC
  Filled 2013-05-02: qty 10

## 2013-05-02 MED ORDER — MIDAZOLAM HCL 5 MG/5ML IJ SOLN
INTRAMUSCULAR | Status: DC | PRN
  Administered 2013-05-02: 2 mg via INTRAVENOUS

## 2013-05-02 MED ORDER — SODIUM CHLORIDE 0.9 % IV SOLN
INTRAVENOUS | Status: DC
  Administered 2013-05-02: 10:00:00 via INTRAVENOUS

## 2013-05-02 MED ORDER — ONDANSETRON HCL 4 MG/2ML IJ SOLN
INTRAMUSCULAR | Status: DC | PRN
  Administered 2013-05-02: 4 mg via INTRAVENOUS

## 2013-05-02 MED ORDER — MEPERIDINE HCL 100 MG/ML IJ SOLN
INTRAMUSCULAR | Status: AC
  Filled 2013-05-02: qty 2

## 2013-05-02 MED ORDER — LIDOCAINE VISCOUS 2 % MT SOLN
OROMUCOSAL | Status: DC | PRN
  Administered 2013-05-02: 3 mL via OROMUCOSAL

## 2013-05-02 MED ORDER — ATROPINE SULFATE 1 MG/ML IJ SOLN
INTRAMUSCULAR | Status: AC
  Filled 2013-05-02: qty 1

## 2013-05-02 NOTE — Interval H&P Note (Signed)
History and Physical Interval Note:  05/02/2013 10:40 AM  Christopher Fields  has presented today for surgery, with the diagnosis of ESOPHAGEAL DYSPHAGIA, GERD  The various methods of treatment have been discussed with the patient and family. After consideration of risks, benefits and other options for treatment, the patient has consented to  Procedure(s) with comments: ESOPHAGOGASTRODUODENOSCOPY (EGD) WITH ESOPHAGEAL DILATION (N/A) - 11:00 as a surgical intervention .  The patient's history has been reviewed, patient examined, no change in status, stable for surgery.  I have reviewed the patient's chart and labs.  Questions were answered to the patient's satisfaction.     No change. EGD with esophageal dilation as appropriate.The risks, benefits, limitations, alternatives and imponderables have been reviewed with the patient. Potential for esophageal dilation, biopsy, etc. have also been reviewed.  Questions have been answered. All parties agreeable.  Gerrit Friendsobert M Rourk

## 2013-05-02 NOTE — H&P (View-Only) (Signed)
Primary Care Physician:  KNOWLTON,STEPHEN D, MD  Primary Gastroenterologist:  Michael Rourk, MD   Chief Complaint  Patient presents with  . Dysphagia    HPI:  Christopher Fields is a 78 y.o. male here for further evaluation of esophageal dysphagia. He was last seen in January 2013 for the same symptoms. He had an EGD which revealed a cervical esophageal web and antral erosions (no H. pylori). He underwent a 56 French Maloney dilator at that time.  Her the past couple months he's noted increasing difficulty swallowing. Food stops in the upper esophagus. He feels like his breath is cut off and he begins getting blurry vision.  Lasts for few seconds. Happens with peanut and mayonnaise sandwiches. No heartburn on protonix. Has problems swallowing bigger pills. Denies odynophagia. BM regular. No melena, brbpr, abdominal pain, n/v. No weight loss.   Current Outpatient Prescriptions  Medication Sig Dispense Refill  . aspirin 81 MG tablet Take 81 mg by mouth daily.        . benazepril-hydrochlorthiazide (LOTENSIN HCT) 10-12.5 MG per tablet Take 1 tablet by mouth daily.        . clopidogrel (PLAVIX) 75 MG tablet Take 75 mg by mouth daily.        . gabapentin (NEURONTIN) 100 MG capsule Take 100 mg by mouth 2 (two) times daily.       . glipiZIDE (GLUCOTROL) 5 MG tablet Take 5 mg by mouth daily before breakfast.       . metFORMIN (GLUMETZA) 500 MG (MOD) 24 hr tablet Take 500 mg by mouth daily with breakfast.        . metoprolol (LOPRESSOR) 50 MG tablet Take 50 mg by mouth 2 (two) times daily.        . pantoprazole (PROTONIX) 40 MG tablet Take 40 mg by mouth 2 (two) times daily.       . simvastatin (ZOCOR) 40 MG tablet Take 40 mg by mouth at bedtime.         No current facility-administered medications for this visit.    Allergies as of 04/17/2013 - Review Complete 04/17/2013  Allergen Reaction Noted  . Penicillins Itching and Rash 12/31/2010    Past Medical History  Diagnosis Date  . Diabetes  mellitus   . Hypertension   . GERD (gastroesophageal reflux disease)     Past Surgical History  Procedure Laterality Date  . Colonoscopy  07/01/06    normal colon colonic mucosa appeared normal  . Coronary angioplasty with stent placement    . Esophagogastroduodenoscopy  01/21/2011    RMR: Hiatal hernia. 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 Gastric erosions-status post biopsy (no h.pyori)    Family History  Problem Relation Age of Onset  . Colon cancer Sister     deceased age 67    History   Social History  . Marital Status: Married    Spouse Name: N/A    Number of Children: N/A  . Years of Education: N/A   Occupational History  . Not on file.   Social History Main Topics  . Smoking status: Former Smoker -- 0.50 packs/day    Types: Cigarettes  . Smokeless tobacco: Not on file     Comment: as teenager  . Alcohol Use: No  . Drug Use: No  . Sexual Activity: Not on file   Other Topics Concern  . Not on file   Social History Narrative  . No narrative on file      ROS:  General: Negative   for anorexia, weight loss, fever, chills, fatigue, weakness. Eyes: Negative for vision changes.  ENT: Negative for hoarseness, difficulty swallowing , nasal congestion. CV: Negative for chest pain, angina, palpitations, dyspnea on exertion, peripheral edema.  Respiratory: Negative for dyspnea at rest, dyspnea on exertion, cough, sputum, wheezing.  GI: See history of present illness. GU:  Negative for dysuria, hematuria, urinary incontinence, urinary frequency, nocturnal urination.  MS: Negative for joint pain, low back pain.  Derm: Negative for rash or itching.  Neuro: Negative for weakness, abnormal sensation, seizure, frequent headaches, memory loss, confusion.  Psych: Negative for anxiety, depression, suicidal ideation, hallucinations.  Endo: Negative for unusual weight change.  Heme: Negative for bruising or bleeding. Allergy: Negative for rash or hives.     Physical Examination:  BP 119/64  Pulse 63  Temp(Src) 97.8 F (36.6 C) (Oral)  Ht 5\' 3"  (1.6 m)  Wt 162 lb (73.483 kg)  BMI 28.70 kg/m2   General: Well-nourished, well-developed in no acute distress.  Head: Normocephalic, atraumatic.   Eyes: Conjunctiva pink, no icterus. Mouth: Oropharyngeal mucosa moist and pink , no lesions erythema or exudate. Neck: Supple without thyromegaly, masses, or lymphadenopathy.  Lungs: Clear to auscultation bilaterally.  Heart: Regular rate and rhythm, no murmurs rubs or gallops.  Abdomen: Bowel sounds are normal, nontender, nondistended, no hepatosplenomegaly or masses, no abdominal bruits or    hernia , no rebound or guarding.   Rectal: Not performed Extremities: No lower extremity edema. No clubbing or deformities.  Neuro: Alert and oriented x 4 , grossly normal neurologically.  Skin: Warm and dry, no rash or jaundice.   Psych: Alert and cooperative, normal mood and affect.

## 2013-05-02 NOTE — Discharge Instructions (Addendum)
EGD Discharge instructions Please read the instructions outlined below and refer to this sheet in the next few weeks. These discharge instructions provide you with general information on caring for yourself after you leave the hospital. Your doctor may also give you specific instructions. While your treatment has been planned according to the most current medical practices available, unavoidable complications occasionally occur. If you have any problems or questions after discharge, please call your doctor. ACTIVITY  You may resume your regular activity but move at a slower pace for the next 24 hours.   Take frequent rest periods for the next 24 hours.   Walking will help expel (get rid of) the air and reduce the bloated feeling in your abdomen.   No driving for 24 hours (because of the anesthesia (medicine) used during the test).   You may shower.   Do not sign any important legal documents or operate any machinery for 24 hours (because of the anesthesia used during the test).  NUTRITION  Drink plenty of fluids.   You may resume your normal diet.   Begin with a light meal and progress to your normal diet.   Avoid alcoholic beverages for 24 hours or as instructed by your caregiver.  MEDICATIONS  You may resume your normal medications unless your caregiver tells you otherwise.  WHAT YOU CAN EXPECT TODAY  You may experience abdominal discomfort such as a feeling of fullness or gas pains.  FOLLOW-UP  Your doctor will discuss the results of your test with you.  SEEK IMMEDIATE MEDICAL ATTENTION IF ANY OF THE FOLLOWING OCCUR:  Excessive nausea (feeling sick to your stomach) and/or vomiting.   Severe abdominal pain and distention (swelling).   Trouble swallowing.   Temperature over 101 F (37.8 C).   Rectal bleeding or vomiting of blood.     Continue Protonix daily   Need to see a cardiologist regarding very slow heartbeat observed today during your procedure.  Office  followup with us in 3 months.   Please make patient an ASAP appointment [facility] in cardiology regarding possible sick sinus syndrome; needs evaluation of bradycardia/sinus pauses observed during his procedure today

## 2013-05-02 NOTE — Op Note (Signed)
[facility] [address] [place], [id number]   ENDOSCOPY PROCEDURE REPORT  PATIENT: [name], Christopher C.  MR#: [id number] BIRTHDATE: 1934-07-03 , 78  yrs. old GENDER: Male ENDOSCOPIST: R.  Roetta SessionsMichael Avyon Herendeen, MD FACP FACG REFERRED BY:  Gareth MorganSteve Knowlton, M.D.  Nona DellSamuel McDowell, M.D. PROCEDURE DATE:  05/02/2013 PROCEDURE:     EGD with Elease HashimotoMaloney dilation  INDICATIONS:     Recurrent esophageal dysphagia; history of cervical esophageal web  INFORMED CONSENT:   The risks, benefits, limitations, alternatives and imponderables have been discussed.  The potential for biopsy, esophogeal dilation, etc. have also been reviewed.  Questions have been answered.  All parties agreeable.  Please see the history and physical in the medical record for more information.  MEDICATIONS:    Versed 2 mg IV and Demerol 50 mg IV in divided doses. Xylocaine gel orally. Zofran 4 mg IV  DESCRIPTION OF PROCEDURE:   The [id number] ([id number]  endoscope was introduced through the mouth and advanced to the second portion of the duodenum without difficulty or limitations.  The mucosal surfaces were surveyed very carefully during advancement of the scope and upon withdrawal.  Retroflexion view of the proximal stomach and esophagogastric junction was performed.      FINDINGS: Patent, normal-appearing tubular esophagus. Stomach empty. Very small hiatal hernia; otherwise, the gastric mucosa appeared normal. Patent pylorus. Normal first and second portion of the duodenum  THERAPEUTIC / DIAGNOSTIC MANEUVERS PERFORMED:   A 56 French Maloney dilator was passed to full insertion with mild to moderate resistance. A look back revealed a superficial 2 cm tear at the level of and just below the upper esophageal sphincter. Otherwise, no apparent complication related to this maneuver. It is notable that when I passed the scope through his hypopharynx into his esophagus, before and after dilation (and during  passage of the dilator itself), the patients heart rate plummeted transiently with good 8-12 second pauses. Nearly instantaneously after the above-mentioned manipulations, heart rate came right back into the 70s.. The patient had no rhythm disturbance whatsoever during gastric insufflation or during other portions of the examination or post-procedure. These were self limiting and he required no intervention.    COMPLICATIONS:  None  IMPRESSION:  Probable cervical esophageal web - status post Maloney dilation as described above.  Transient, self limiting significant bradycardia with sinus pauses with manipulation of his upper GI tract as described above. Discussed with wife. Patient has been having lightheadedness and presyncopal episodes recently. No recent cardiology evaluation. Suspect sick sinus syndrome.  RECOMMENDATIONS:   Continue Protonix 40 mg twice daily. I will take the liberty of making this nice gentleman an appointment  [facility] in the near future. This issue certainly needs to be sorted out before he returns for colonoscopy in the coming months.  I have asked the nursing staff to scan the rhythm strips into Epic.    _______________________________ R. Roetta SessionsMichael Rishi Vicario, MD FACP [facility] eSigned:  R. Roetta SessionsMichael Acire Tang, MD FACP [facility] 05/02/2013 11:15 AM     CC:  PATIENT NAME:  [name], Christopher C. MR#: [id number]

## 2013-05-02 NOTE — Progress Notes (Signed)
12 lead EKG performed per MD order, appt [facility] with [facility] Zackery [facility]footK[facility]waran [facility] for 05/04/13. All tracings faxed to office.

## 2013-05-04 ENCOUNTER — Ambulatory Visit (INDEPENDENT_AMBULATORY_CARE_PROVIDER_SITE_OTHER): Payer: Medicare Other | Admitting: Cardiovascular Disease

## 2013-05-04 ENCOUNTER — Encounter: Payer: Self-pay | Admitting: Cardiovascular Disease

## 2013-05-04 ENCOUNTER — Encounter: Payer: Self-pay | Admitting: *Deleted

## 2013-05-04 VITALS — BP 159/69 | HR 67 | Ht 63.0 in | Wt 161.0 lb

## 2013-05-04 DIAGNOSIS — I2510 Atherosclerotic heart disease of native coronary artery without angina pectoris: Secondary | ICD-10-CM

## 2013-05-04 DIAGNOSIS — R079 Chest pain, unspecified: Secondary | ICD-10-CM

## 2013-05-04 DIAGNOSIS — R001 Bradycardia, unspecified: Secondary | ICD-10-CM

## 2013-05-04 DIAGNOSIS — G4733 Obstructive sleep apnea (adult) (pediatric): Secondary | ICD-10-CM

## 2013-05-04 DIAGNOSIS — I10 Essential (primary) hypertension: Secondary | ICD-10-CM

## 2013-05-04 DIAGNOSIS — R55 Syncope and collapse: Secondary | ICD-10-CM

## 2013-05-04 DIAGNOSIS — E785 Hyperlipidemia, unspecified: Secondary | ICD-10-CM

## 2013-05-04 DIAGNOSIS — I498 Other specified cardiac arrhythmias: Secondary | ICD-10-CM

## 2013-05-04 MED ORDER — BENAZEPRIL-HYDROCHLOROTHIAZIDE 10-12.5 MG PO TABS: 1.0000 | ORAL_TABLET | Freq: Every morning | ORAL | Status: DC

## 2013-05-04 MED ORDER — BENAZEPRIL HCL 10 MG PO TABS: 10.0000 mg | ORAL_TABLET | Freq: Every evening | ORAL | Status: DC

## 2013-05-04 NOTE — Addendum Note (Signed)
Addended by: Lesle ChrisHILL, Micala Saltsman G on: 05/04/2013 09:51 AM   Modules accepted: Orders

## 2013-05-04 NOTE — Patient Instructions (Addendum)
   Referral to Dr. Josephina ShihEwain Wilson for sleep study - their office will contact you with date  Your physician has recommended that you wear a 30 day event monitor. Event monitors are medical devices that record the heart's electrical activity. Doctors most often us these monitors to diagnose arrhythmias. Arrhythmias are problems with the speed or rhythm of the heartbeat. The monitor is a small, portable device. You can wear one while you do your normal daily activities. This is usually used to diagnose what is causing palpitations/syncope (passing out). - this will be mailed to your home. Your physician has requested that you have en exercise stress myoview. For further information please visit [email or web address]. Please follow instruction sheet, as given. Your physician has requested that you have an echocardiogram. Echocardiography is a painless test that uses sound waves to create images of your heart. It provides your doctor with information about the size and shape of your heart and how well your heart's chambers and valves are working. This procedure takes approximately one hour. There are no restrictions for this procedure. Stop Plavix  Continue your Benazepril-HCTZ 10-12.5 daily, but take in the morning.   Add Benazepril 10mg  every evening  Continue all other current medications. Office will contact with results via phone or letter.   Follow up in  6 weeks

## 2013-05-04 NOTE — Progress Notes (Signed)
Patient ID: Christopher Fields Shankles, male   DOB: Dec 31, 1934, 79 y.o.   MRN: [id number]       CARDIOLOGY CONSULT NOTE  Patient ID: Christopher Fields Purdon MRN: [id number] DOB/AGE: Dec 31, 1934 79 y.o.  Admit date: (Not on file) Primary Physician Milana ObeyKNOWLTON,STEPHEN D, MD  Reason for Consultation: presyncope, sinus pauses  HPI: The patient is a 79 year old male with a history of hypertension, diabetes mellitus, and hyperlipidemia, as well as coronary artery disease. He recently underwent an EGD by Dr. Jena Gaussourk on 05/02/2013. He was diagnosed with a probable cervical esophageal web and underwent a Maloney dilation. As per the notes, he had "a transient, self limiting bradycardia with sinus pauses with manipulation of his upper GI tract". The medical records also state that the patient has been experiencing lightheadedness and presyncopal episodes recently. A 12-lead ECG from 05/02/2013 shows sinus bradycardia, 52 beats per minute, with a first-degree AV block and a PR interval of 228 ms. Additional tracings show a 2.2 second pause. There is an additional tracing which may show an 8 second pause and another with a 4.2 second pause. An ECG performed in the office today shows sinus bradycardia, 55 beats per minute, with a left axis deviation and an axis of -32.  He tells me that he had a stent placed in 2006. Upon review of his cath lab report, the following was demonstrated:  FINAL DIAGNOSES:  1. Two-vessel coronary artery disease.  1. 99% proximal left anterior descending stenosis.  2. 99% ostial diagonal stenosis of the first diagonal.  3. 60-70% ostial posterior descending artery stenosis.  4. 30% mid right coronary artery stenosis.  2. Normal left ventricular systolic function.  3. Normal abdominal aorta.  4. Normal renal arteries.   He actually underwent percutaneous coronary intervention of two vessels: 1. Stenting of proximal left anterior descending.  2. Cutting balloon angioplasty of ostial diagonal  branch of left  anterior descending.   He said that he occasionally feels lightheaded and dizzy primarily when he is doing work outside. He checks his blood sugar and it has been as low as 50. He very seldom experiences a sensation in the left inframammary region which spontaneously resolves, and does not feel it is a significant pain. He has been experiencing pain at the base of his neck and on both sides of his neck. He denies exertional dyspnea. He said that prior to his esophageal dilatation, whenever he ate food, it would get stuck in his throat and during those moments, he would feel like he was going to pass out.  Prior to his coronary artery stenting, he said that his primary symptoms were "heart racing" and breaking out in a cold sweat. He denies antecedent chest pain. Upon further discussion, his wife says that the patient snores a lot and will often stop breathing for several seconds.    Allergies  Allergen Reactions  . Penicillins Itching and Rash    Current Outpatient Prescriptions  Medication Sig Dispense Refill  . aspirin 81 MG tablet Take 81 mg by mouth daily.        . benazepril-hydrochlorthiazide (LOTENSIN HCT) 10-12.5 MG per tablet Take 1 tablet by mouth daily.        . clopidogrel (PLAVIX) 75 MG tablet Take 75 mg by mouth daily.        Marland Kitchen. gabapentin (NEURONTIN) 100 MG capsule Take 100 mg by mouth 3 (three) times daily.       Marland Kitchen. glipiZIDE (GLUCOTROL) 5 MG tablet Take 5 mg by  mouth daily before breakfast.       . metFORMIN (GLUMETZA) 500 MG (MOD) 24 hr tablet Take 500-1,000 mg by mouth 3 (three) times daily. 2 tablets in the morning, 1 tablet at noon, and 1 tablet in the evening.      . pantoprazole (PROTONIX) 40 MG tablet Take 40 mg by mouth 2 (two) times daily.       . simvastatin (ZOCOR) 40 MG tablet Take 40 mg by mouth at bedtime.         No current facility-administered medications for this visit.    Past Medical History  Diagnosis Date  . Diabetes mellitus   .  Hypertension   . GERD (gastroesophageal reflux disease)   . CAD (coronary artery disease)     Past Surgical History  Procedure Laterality Date  . Colonoscopy  07/01/06    normal colon colonic mucosa appeared normal  . Coronary angioplasty with stent placement  2007  . Esophagogastroduodenoscopy  01/21/2011    RMR: Hiatal hernia. 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 Gastric erosions-status post biopsy (no h.pyori)    History   Social History  . Marital Status: Married    Spouse Name: N/A    Number of Children: N/A  . Years of Education: N/A   Occupational History  . Not on file.   Social History Main Topics  . Smoking status: Former Smoker -- 0.50 packs/day    Types: Cigarettes  . Smokeless tobacco: Not on file     Comment: as teenager  . Alcohol Use: No  . Drug Use: No  . Sexual Activity: Not on file   Other Topics Concern  . Not on file   Social History Narrative  . No narrative on file     No family history of premature CAD in 1st degree relatives.  Prior to Admission medications   Medication Sig Start Date End Date Taking? Authorizing Provider  aspirin 81 MG tablet Take 81 mg by mouth daily.      Historical Provider, MD  benazepril-hydrochlorthiazide (LOTENSIN HCT) 10-12.5 MG per tablet Take 1 tablet by mouth daily.      Historical Provider, MD  clopidogrel (PLAVIX) 75 MG tablet Take 75 mg by mouth daily.      Historical Provider, MD  gabapentin (NEURONTIN) 100 MG capsule Take 100 mg by mouth 3 (three) times daily.  03/21/13   Historical Provider, MD  glipiZIDE (GLUCOTROL) 5 MG tablet Take 5 mg by mouth daily before breakfast.  03/31/13   Historical Provider, MD  metFORMIN (GLUMETZA) 500 MG (MOD) 24 hr tablet Take 500-1,000 mg by mouth 3 (three) times daily. 2 tablets in the morning, 1 tablet at noon, and 1 tablet in the evening.    Historical Provider, MD  pantoprazole (PROTONIX) 40 MG tablet Take 40 mg by mouth 2 (two) times daily.     Historical  Provider, MD  simvastatin (ZOCOR) 40 MG tablet Take 40 mg by mouth at bedtime.      Historical Provider, MD     Review of systems complete and found to be negative unless listed above in HPI     Physical exam Height 5\' 3"  (1.6 m), weight 161 lb (73.029 kg). General: NAD Neck: No JVD, no thyromegaly or thyroid nodule.  Lungs: Clear to auscultation bilaterally with normal respiratory effort. CV: Nondisplaced PMI.  Heart regular rhythm, mildly bradycardic, normal S1/S2, no S3/S4, no murmur.  No peripheral edema.  No carotid bruit.  Normal pedal pulses.  Abdomen: Soft,  nontender, no hepatosplenomegaly, no distention.  Skin: Intact without lesions or rashes.  Neurologic: Alert and oriented x 3.  Psych: Normal affect. Extremities: No clubbing or cyanosis.  HEENT: Normal.   Labs:   Lab Results  Component Value Date   WBC 6.3 06/06/2007   HGB 15.5 06/06/2007   HCT 46.2 06/06/2007   MCV 89.2 06/06/2007   PLT 233 06/06/2007   No results found for this basename: NA, K, CL, CO2, BUN, CREATININE, CALCIUM, LABALBU, PROT, BILITOT, ALKPHOS, ALT, AST, GLUCOSE,  in the last 168 hours No results found for this basename: CKTOTAL, CKMB, CKMBINDEX, TROPONINI    Lab Results  Component Value Date   CHOL 158 09/08/2007   CHOL 169 06/06/2007   CHOL 199 08/29/2006   Lab Results  Component Value Date   HDL 37* 09/08/2007   HDL 34* 06/06/2007   HDL 32* 08/29/2006   Lab Results  Component Value Date   LDLCALC 93 09/08/2007   LDLCALC 98 06/06/2007   LDLCALC 130* 08/29/2006   Lab Results  Component Value Date   TRIG 142 09/08/2007   TRIG 183* 06/06/2007   TRIG 185* 08/29/2006   Lab Results  Component Value Date   CHOLHDL 4.3 Ratio 09/08/2007   CHOLHDL 5.0 Ratio 06/06/2007   CHOLHDL 6.2 Ratio 08/29/2006   No results found for this basename: LDLDIRECT         Studies: No results found.  ASSESSMENT AND PLAN: 1. Presyncope, dizziness, and sinus pauses: He is not on any rate slowing medications  which could potentially exacerbate bradycardia with sinus pauses. By history, it appears that he has untreated sleep apnea which could also potentially cause bradycardia, sinus pauses, and other arrhythmic disturbances. Some of his symptoms could potentially be attributed to hypoglycemia. I will have him wear a 30 day event monitor to evaluate for symptomatic bradycardia and sinus pauses. If he does indeed have sick sinus syndrome, he'll require pacemaker placement. I will also obtain a sleep study to evaluate for sleep apnea. Given that he had significant posterior descending artery disease in 2006, along with his current complaints of chest pain, I will obtain an exercise Cardiolite stress test. I will also obtain an echocardiogram to evaluate for structural heart disease and to assess left ventricular systolic function. 2. Chest pain: Given that he had significant posterior descending artery disease in 2006, along with his current complaints of chest pain, I will obtain an exercise Cardiolite stress test. He is currently still taking Plavix, which will be discontinued. I will continue aspirin and simvastatin. 3. Hypertension: Uncontrolled today. I will increase benazepril to 20 mg as part of his Lotensin/hydrochlorothiazide combination pill. 4. Hyperlipidemia: I will check to see if a recent lipid panel was ordered by his primary care physician. He is currently taking simvastatin 40 mg daily. 5. Sleep apnea: He has a very clear history of sleep apnea. I will proceed with a sleep study.  Dispo: f/u 6 weeks.  Signed: Prentice DockerSuresh Janeshia Ciliberto, M.D., F.A.Fields.Fields.  05/04/2013, 8:37 AM

## 2013-05-07 ENCOUNTER — Other Ambulatory Visit: Payer: Self-pay | Admitting: *Deleted

## 2013-05-07 ENCOUNTER — Encounter (HOSPITAL_COMMUNITY): Payer: Self-pay | Admitting: Internal Medicine

## 2013-05-07 MED ORDER — SIMVASTATIN 80 MG PO TABS: 80.0000 mg | ORAL_TABLET | Freq: Every day | ORAL | Status: DC

## 2013-05-08 NOTE — Progress Notes (Signed)
EKG faxed to Dr. Sudie BaileyKnowlton.

## 2013-05-09 ENCOUNTER — Other Ambulatory Visit (INDEPENDENT_AMBULATORY_CARE_PROVIDER_SITE_OTHER): Payer: Medicare Other

## 2013-05-09 ENCOUNTER — Other Ambulatory Visit: Payer: Self-pay

## 2013-05-09 ENCOUNTER — Other Ambulatory Visit: Payer: Self-pay | Admitting: *Deleted

## 2013-05-09 DIAGNOSIS — R079 Chest pain, unspecified: Secondary | ICD-10-CM

## 2013-05-09 DIAGNOSIS — I2510 Atherosclerotic heart disease of native coronary artery without angina pectoris: Secondary | ICD-10-CM

## 2013-05-09 DIAGNOSIS — R55 Syncope and collapse: Secondary | ICD-10-CM

## 2013-05-09 DIAGNOSIS — I498 Other specified cardiac arrhythmias: Secondary | ICD-10-CM

## 2013-05-09 DIAGNOSIS — R001 Bradycardia, unspecified: Secondary | ICD-10-CM

## 2013-05-11 DIAGNOSIS — R55 Syncope and collapse: Secondary | ICD-10-CM

## 2013-05-11 DIAGNOSIS — R079 Chest pain, unspecified: Secondary | ICD-10-CM

## 2013-05-15 ENCOUNTER — Encounter: Payer: Self-pay | Admitting: *Deleted

## 2013-05-18 ENCOUNTER — Encounter (HOSPITAL_COMMUNITY)
Admission: RE | Admit: 2013-05-18 | Discharge: 2013-05-18 | Disposition: A | Discharge status: Home / Self Care | Payer: Medicare Other | Source: Ambulatory Visit | Attending: Cardiovascular Disease | Admitting: Cardiovascular Disease

## 2013-05-18 ENCOUNTER — Encounter (HOSPITAL_COMMUNITY): Payer: Self-pay

## 2013-05-18 ENCOUNTER — Encounter: Payer: Self-pay | Admitting: *Deleted

## 2013-05-18 DIAGNOSIS — E785 Hyperlipidemia, unspecified: Secondary | ICD-10-CM | POA: Insufficient documentation

## 2013-05-18 DIAGNOSIS — I2510 Atherosclerotic heart disease of native coronary artery without angina pectoris: Secondary | ICD-10-CM

## 2013-05-18 DIAGNOSIS — R079 Chest pain, unspecified: Secondary | ICD-10-CM

## 2013-05-18 DIAGNOSIS — Z9861 Coronary angioplasty status: Secondary | ICD-10-CM | POA: Insufficient documentation

## 2013-05-18 DIAGNOSIS — R55 Syncope and collapse: Secondary | ICD-10-CM

## 2013-05-18 DIAGNOSIS — I10 Essential (primary) hypertension: Secondary | ICD-10-CM | POA: Insufficient documentation

## 2013-05-18 MED ORDER — REGADENOSON 0.4 MG/5ML IV SOLN
INTRAVENOUS | Status: AC
  Filled 2013-05-18: qty 5

## 2013-05-18 MED ORDER — SODIUM CHLORIDE 0.9 % IJ SOLN
INTRAMUSCULAR | Status: AC
  Administered 2013-05-18: 10 mL via INTRAVENOUS
  Filled 2013-05-18: qty 10

## 2013-05-18 MED ORDER — TECHNETIUM TC 99M SESTAMIBI GENERIC - CARDIOLITE
10.0000 | Freq: Once | INTRAVENOUS | Status: AC | PRN
  Administered 2013-05-18: 10 via INTRAVENOUS

## 2013-05-18 MED ORDER — TECHNETIUM TC 99M SESTAMIBI - CARDIOLITE
30.0000 | Freq: Once | INTRAVENOUS | Status: AC | PRN
  Administered 2013-05-18: 11:00:00 30 via INTRAVENOUS

## 2013-05-18 NOTE — Progress Notes (Signed)
Stress Lab Nurses Notes - Christopher Fields  Christopher SleeperLarry C Fields 05/18/2013  Reason for doing test: CAD, presyncope & sinus pauses Type of test: Stress Cardiolite Nurse performing test: Christopher PoissonPhyllis Billingsly, RN Nuclear Medicine Tech: Christopher Fields Echo Tech: Not Applicable MD performing test: Koneswaran/K.Lawrence NP Family MD: Sudie BaileyKnowlton Test explained and consent signed: yes IV started: 22g jelco, Saline lock flushed, No redness or edema and Saline lock started in radiology Symptoms: Chest tightness & Fatigue in legs Treatment/Intervention: None Reason test stopped: fatigue After recovery IV was: Discontinued via X-ray tech and No redness or edema Patient to return to Nuc. Med at :11:30 Patient discharged: Home Patient's Condition upon discharge was: stable Comments: During test peak BP 146/69 & HR 122.  Recovery BP 140/70 & HR 60.  Symptoms resolved in recovery. Tawni MillersPhyllis T Deshawn Skelley

## 2013-06-15 ENCOUNTER — Ambulatory Visit: Payer: Medicare Other | Admitting: Cardiovascular Disease

## 2013-07-01 ENCOUNTER — Encounter: Payer: Self-pay | Admitting: Internal Medicine

## 2013-07-13 ENCOUNTER — Encounter: Payer: Self-pay | Admitting: Cardiovascular Disease

## 2013-07-13 ENCOUNTER — Ambulatory Visit (INDEPENDENT_AMBULATORY_CARE_PROVIDER_SITE_OTHER): Payer: Medicare Other | Admitting: Cardiovascular Disease

## 2013-07-13 VITALS — BP 152/74 | HR 59 | Ht 63.0 in | Wt 166.0 lb

## 2013-07-13 DIAGNOSIS — I159 Secondary hypertension, unspecified: Secondary | ICD-10-CM

## 2013-07-13 DIAGNOSIS — E785 Hyperlipidemia, unspecified: Secondary | ICD-10-CM

## 2013-07-13 DIAGNOSIS — R55 Syncope and collapse: Secondary | ICD-10-CM

## 2013-07-13 DIAGNOSIS — G4733 Obstructive sleep apnea (adult) (pediatric): Secondary | ICD-10-CM

## 2013-07-13 DIAGNOSIS — I158 Other secondary hypertension: Secondary | ICD-10-CM

## 2013-07-13 DIAGNOSIS — R001 Bradycardia, unspecified: Secondary | ICD-10-CM

## 2013-07-13 DIAGNOSIS — I2510 Atherosclerotic heart disease of native coronary artery without angina pectoris: Secondary | ICD-10-CM

## 2013-07-13 DIAGNOSIS — I498 Other specified cardiac arrhythmias: Secondary | ICD-10-CM

## 2013-07-13 NOTE — Progress Notes (Signed)
Patient ID: Christopher Fields, male   DOB: [birth year], 79 y.o.   MRN: [id number]      SUBJECTIVE: The patient is here to followup after going a series of tests to evaluate chest pain as well as presyncope, dizziness, and sleep apnea. Echocardiography demonstrated normal left ventricular systolic function, EF 60-65%, mild LVH, grade 1 diastolic dysfunction, and mild mitral regurgitation. Sleep study demonstrated moderate obstructive sleep apnea. He underwent a normal exercise Cardiolite stress test. Event monitoring demonstrated normal sinus rhythm and the patient was asymptomatic.  He has been feeling well and very seldom experiences lightheadedness or dizziness. He's had no further episodes of chest pain. He has been unable to meet with the technicians to show him how to properly use his CPAP machine.  Allergies  Allergen Reactions  . Penicillins Itching and Rash    Current Outpatient Prescriptions  Medication Sig Dispense Refill  . aspirin 81 MG tablet Take 81 mg by mouth daily.        . benazepril (LOTENSIN) 10 MG tablet Take 1 tablet (10 mg total) by mouth every evening.  30 tablet  6  . benazepril-hydrochlorthiazide (LOTENSIN HCT) 10-12.5 MG per tablet Take 1 tablet by mouth every morning.      . gabapentin (NEURONTIN) 100 MG capsule Take 100 mg by mouth 3 (three) times daily.       Marland Kitchen. glipiZIDE (GLUCOTROL) 5 MG tablet Take 5 mg by mouth daily before breakfast.       . metFORMIN (GLUMETZA) 500 MG (MOD) 24 hr tablet Take 500-1,000 mg by mouth 3 (three) times daily. 2 tablets in the morning, 1 tablet at noon, and 1 tablet in the evening.      . pantoprazole (PROTONIX) 40 MG tablet Take 40 mg by mouth 2 (two) times daily.       . simvastatin (ZOCOR) 80 MG tablet Take 1 tablet (80 mg total) by mouth daily.  30 tablet  6   No current facility-administered medications for this visit.    Past Medical History  Diagnosis Date  . Diabetes mellitus   . Hypertension   . GERD (gastroesophageal  reflux disease)   . CAD (coronary artery disease)     Past Surgical History  Procedure Laterality Date  . Colonoscopy  07/01/06    normal colon colonic mucosa appeared normal  . Coronary angioplasty with stent placement  2007  . Esophagogastroduodenoscopy  01/21/2011    RMR: Hiatal hernia. 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 Gastric erosions-status post biopsy (no h.pyori)  . Esophagogastroduodenoscopy (egd) with esophageal dilation N/A 05/02/2013    Procedure: ESOPHAGOGASTRODUODENOSCOPY (EGD) WITH ESOPHAGEAL DILATION;  Surgeon: Corbin Adeobert M Rourk, MD;  Location: [facility];  Service: Endoscopy;  Laterality: N/A;  11:00    History   Social History  . Marital Status: Married    Spouse Name: N/A    Number of Children: N/A  . Years of Education: N/A   Occupational History  . Not on file.   Social History Main Topics  . Smoking status: Former Smoker -- 0.50 packs/day for .3 years    Types: Cigarettes    Start date: 06/04/1946    Quit date: 06/03/1949  . Smokeless tobacco: Never Used     Comment: as teenager  . Alcohol Use: No  . Drug Use: No  . Sexual Activity: Not on file   Other Topics Concern  . Not on file   Social History Narrative  . No narrative on file  Filed Vitals:   07/13/13 1021  Height: 5\' 3"  (1.6 m)   BP 152/74  Pulse 59   PHYSICAL EXAM General: NAD Neck: No JVD, no thyromegaly. Lungs: Clear to auscultation bilaterally with normal respiratory effort. CV: Nondisplaced PMI.  Regular rate and rhythm, normal S1/S2, no S3/S4, no murmur. No pretibial or periankle edema.  No carotid bruit.  Normal pedal pulses.  Abdomen: Soft, nontender, no hepatosplenomegaly, no distention.  Neurologic: Alert and oriented x 3.  Psych: Normal affect. Extremities: No clubbing or cyanosis.   ECG: reviewed and available in electronic records.      ASSESSMENT AND PLAN: 1. Presyncope, dizziness, and sinus pauses: No episodes of significant  bradycardia or pauses on event monitoring. He is not on any rate slowing medications which could potentially exacerbate bradycardia with sinus pauses, and current HR is 59 bpm. He has now been formally diagnosed with moderate obstructive sleep apnea, which could also potentially cause bradycardia, sinus pauses, and other arrhythmic disturbances. Some of his prior symptoms could potentially be attributed to hypoglycemia. 2. Chest pain: Normal exercise Cardiolite stress test. No changes to medical therapy. 3. Hypertension: Mildly elevated today. Will see if it improves with treatment for sleep apnea. 4. Hyperlipidemia: Currently taking simvastatin 40 mg daily.  5. Sleep apnea: Plan to commence Rx with CPAP.  Dispo: f/u 6 months.  Prentice DockerSuresh Koneswaran, M.D., F.A.C.C.

## 2013-07-13 NOTE — Patient Instructions (Signed)
Continue all current medications. Your physician wants you to follow up in: 6 months.  You will receive a reminder letter in the mail one-two months in advance.  If you don't receive a letter, please call our office to schedule the follow up appointment   

## 2014-04-09 ENCOUNTER — Telehealth: Payer: Self-pay | Admitting: *Deleted

## 2014-04-09 MED ORDER — SIMVASTATIN 80 MG PO TABS: 80.0000 mg | ORAL_TABLET | Freq: Every day | ORAL | Status: DC

## 2014-04-09 NOTE — Telephone Encounter (Signed)
Refill request from [facility] for simvastatin 80 mg. 1 month refill sent to pharmacy. Pt needs appt for further refills

## 2014-04-26 ENCOUNTER — Other Ambulatory Visit: Payer: Self-pay | Admitting: *Deleted

## 2014-04-26 MED ORDER — BENAZEPRIL HCL 10 MG PO TABS: 10.0000 mg | ORAL_TABLET | Freq: Every evening | ORAL | Status: DC

## 2014-06-25 ENCOUNTER — Other Ambulatory Visit: Payer: Self-pay | Admitting: Cardiovascular Disease

## 2014-08-03 ENCOUNTER — Other Ambulatory Visit: Payer: Self-pay | Admitting: Cardiovascular Disease

## 2014-10-28 ENCOUNTER — Other Ambulatory Visit: Payer: Self-pay | Admitting: *Deleted

## 2014-10-28 MED ORDER — SIMVASTATIN 80 MG PO TABS: ORAL_TABLET | ORAL | Status: DC

## 2014-12-25 ENCOUNTER — Other Ambulatory Visit: Payer: Self-pay | Admitting: Cardiovascular Disease

## 2015-02-01 ENCOUNTER — Other Ambulatory Visit: Payer: Self-pay | Admitting: Cardiovascular Disease

## 2015-04-21 ENCOUNTER — Emergency Department (HOSPITAL_COMMUNITY)
Admission: EM | Admit: 2015-04-21 | Discharge: 2015-04-21 | Disposition: A | Discharge status: Home / Self Care | Payer: Medicare Other | Attending: Emergency Medicine | Admitting: Emergency Medicine

## 2015-04-21 ENCOUNTER — Emergency Department (HOSPITAL_COMMUNITY): Payer: Medicare Other

## 2015-04-21 ENCOUNTER — Encounter (HOSPITAL_COMMUNITY): Payer: Self-pay | Admitting: Emergency Medicine

## 2015-04-21 DIAGNOSIS — Z79899 Other long term (current) drug therapy: Secondary | ICD-10-CM | POA: Diagnosis not present

## 2015-04-21 DIAGNOSIS — I2510 Atherosclerotic heart disease of native coronary artery without angina pectoris: Secondary | ICD-10-CM | POA: Insufficient documentation

## 2015-04-21 DIAGNOSIS — M545 Low back pain, unspecified: Secondary | ICD-10-CM

## 2015-04-21 DIAGNOSIS — I10 Essential (primary) hypertension: Secondary | ICD-10-CM | POA: Diagnosis not present

## 2015-04-21 DIAGNOSIS — Z7982 Long term (current) use of aspirin: Secondary | ICD-10-CM | POA: Insufficient documentation

## 2015-04-21 DIAGNOSIS — Z87891 Personal history of nicotine dependence: Secondary | ICD-10-CM | POA: Diagnosis not present

## 2015-04-21 DIAGNOSIS — Z7984 Long term (current) use of oral hypoglycemic drugs: Secondary | ICD-10-CM | POA: Insufficient documentation

## 2015-04-21 DIAGNOSIS — E119 Type 2 diabetes mellitus without complications: Secondary | ICD-10-CM | POA: Diagnosis not present

## 2015-04-21 LAB — URINE MICROSCOPIC-ADD ON: RBC / HPF: NONE SEEN RBC/hpf (ref 0–5)

## 2015-04-21 LAB — URINALYSIS, ROUTINE W REFLEX MICROSCOPIC
BILIRUBIN URINE: NEGATIVE
Hgb urine dipstick: NEGATIVE
KETONES UR: NEGATIVE mg/dL
Leukocytes, UA: NEGATIVE
Nitrite: NEGATIVE
PROTEIN: NEGATIVE mg/dL
Specific Gravity, Urine: 1.01 (ref 1.005–1.030)
pH: 6.5 (ref 5.0–8.0)

## 2015-04-21 MED ORDER — HYDROCODONE-ACETAMINOPHEN 5-325 MG PO TABS: 0.5000 | ORAL_TABLET | ORAL | Status: DC | PRN

## 2015-04-21 NOTE — Discharge Instructions (Signed)
Back Pain, Adult °Back pain is very common in adults. The cause of back pain is rarely dangerous and the pain often gets better over time. The cause of your back pain may not be known. Some common causes of back pain include: °· Strain of the muscles or ligaments supporting the spine. °· Wear and tear (degeneration) of the spinal disks. °· Arthritis. °· Direct injury to the back. °For many people, back pain may return. Since back pain is rarely dangerous, most people can learn to manage this condition on their own. °HOME CARE INSTRUCTIONS °Watch your back pain for any changes. The following actions may help to lessen any discomfort you are feeling: °· Remain active. It is stressful on your back to sit or stand in one place for long periods of time. Do not sit, drive, or stand in one place for more than 30 minutes at a time. Take short walks on even surfaces as soon as you are able. Try to increase the length of time you walk each day. °· Exercise regularly as directed by your health care provider. Exercise helps your back heal faster. It also helps avoid future injury by keeping your muscles strong and flexible. °· Do not stay in bed. Resting more than 1-2 days can delay your recovery. °· Pay attention to your body when you bend and lift. The most comfortable positions are those that put less stress on your recovering back. Always use proper lifting techniques, including: °¨ Bending your knees. °¨ Keeping the load close to your body. °¨ Avoiding twisting. °· Find a comfortable position to sleep. Use a firm mattress and lie on your side with your knees slightly bent. If you lie on your back, put a pillow under your knees. °· Avoid feeling anxious or stressed. Stress increases muscle tension and can worsen back pain. It is important to recognize when you are anxious or stressed and learn ways to manage it, such as with exercise. °· Take medicines only as directed by your health care provider. Over-the-counter  medicines to reduce pain and inflammation are often the most helpful. Your health care provider may prescribe muscle relaxant drugs. These medicines help dull your pain so you can more quickly return to your normal activities and healthy exercise. °· Apply ice to the injured area: °¨ Put ice in a plastic bag. °¨ Place a towel between your skin and the bag. °¨ Leave the ice on for 20 minutes, 2-3 times a day for the first 2-3 days. After that, ice and heat may be alternated to reduce pain and spasms. °· Maintain a healthy weight. Excess weight puts extra stress on your back and makes it difficult to maintain good posture. °SEEK MEDICAL CARE IF: °· You have pain that is not relieved with rest or medicine. °· You have increasing pain going down into the legs or buttocks. °· You have pain that does not improve in one week. °· You have night pain. °· You lose weight. °· You have a fever or chills. °SEEK IMMEDIATE MEDICAL CARE IF:  °· You develop new bowel or bladder control problems. °· You have unusual weakness or numbness in your arms or legs. °· You develop nausea or vomiting. °· You develop abdominal pain. °· You feel faint. °  °This information is not intended to replace advice given to you by your health care provider. Make sure you discuss any questions you have with your health care provider. °  °Document Released: 01/04/2005 Document Revised: 01/25/2014 Document Reviewed: 05/08/2013 °Elsevier Interactive Patient Education ©2016 [employer]   [employer]    Do not drive within 4 hours of taking hydrocodone or robaxin as these may make you drowsy.  Avoid lifting,  Bending,  Twisting or any other activity that worsens your pain over the next week.  Apply an  icepack  to your lower back for 10-15 minutes every 2 hours for the next 2 days.  You should get rechecked if your symptoms are not better over the next 5 days,  Or you develop increased pain,  Weakness in your leg(s) or loss of bladder or bowel function - these are  symptoms of a worse injury.

## 2015-04-21 NOTE — ED Notes (Signed)
Pt c/o left lower back pain x 1 month.

## 2015-04-21 NOTE — ED Provider Notes (Signed)
Medical screening examination/treatment/procedure(s) were conducted as a shared visit with non-physician practitioner(s) and myself.  I personally evaluated the patient during the encounter.  81 yo M w/ HTN, new onset back pain. Exam for me benign. No neuro signs/symptoms. Likely MSK, doubt kidney stone, obstruction, spinal pathology. US as below was unremarkable for AAA. Stable for dc with PCP follow up, return here for new/worsening symptoms.   EMERGENCY DEPARTMENT ULTRASOUND  Study: Limited Retroperitoneal Ultrasound of the Abdominal Aorta.  INDICATIONS:Back pain and Age>55 Multiple views of the abdominal aorta were obtained in real-time from the diaphragmatic hiatus to the aortic bifurcation in transverse planes with a multi-frequency probe. PERFORMED BY: Myself IMAGES ARCHIVED?: Yes FINDINGS: Maximum aortic dimensions are 3.1 LIMITATIONS:  Body habitus and Bowel gas INTERPRETATION:  No abdominal aortic aneurysm and Abdominal free fluid absent   CPT Code: 29562-1376775-26 (limited retroperitoneal)    Marily MemosJason Maks Cavallero, MD 04/24/15 [id number]

## 2015-04-21 NOTE — ED Notes (Signed)
Ambulated patient around the halls and his pulse increased to 60 and O2 was 98%.

## 2015-04-21 NOTE — ED Provider Notes (Signed)
CSN: [id number]     Arrival date & time 04/21/15  1424 History   By signing my name below, I, Christopher Fields, attest that this documentation has been prepared under the direction and in the presence of Burgess Amor, PA-C.  Electronically Signed: [name], ED Scribe 04/21/2015 at 10:39 PM.  Chief Complaint  Patient presents with  . Back Pain    The history is provided by the patient. No language interpreter was used.   HPI Comments: Christopher Fields is a 81 y.o. male with PMHx of DM, HTN, CAD, and GERD who presents to the Emergency Department complaining of gradual onset, constant, left lower back pain, ongoing for about one month worsening one day ago. Pt states that when he stood up after church yesterday he felt an intense stabbing pain in his back. He reports associated radiating pain into his left hip while walking. No other associated symptoms noted. He has taken aleve 2x per day with no relief to symptoms. The pain is worsened with movement especially when standing up. No other worsening or alleviating factors noted.  Pt denies back trauma, weakness, numbness, bladder incontinence, bowel incontinence, abdominal pain, chest pain, dysuria, loss of appetite, or any other pertinent symptoms. He has an appointment with his PCP on 05/03/2015 for regular check up. Currently taking gabapentin for peripheral neuropathy.   Past Medical History  Diagnosis Date  . Diabetes mellitus   . Hypertension   . GERD (gastroesophageal reflux disease)   . CAD (coronary artery disease)    Past Surgical History  Procedure Laterality Date  . Colonoscopy  07/01/06    normal colon colonic mucosa appeared normal  . Coronary angioplasty with stent placement  2007  . Esophagogastroduodenoscopy  01/21/2011    RMR: Hiatal hernia. 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 Gastric erosions-status post biopsy (no h.pyori)  . Esophagogastroduodenoscopy (egd) with esophageal dilation N/A 05/02/2013     Procedure: ESOPHAGOGASTRODUODENOSCOPY (EGD) WITH ESOPHAGEAL DILATION;  Surgeon: [name], MD;  Location: [facility];  Service: Endoscopy;  Laterality: N/A;  11:00   Family History  Problem Relation Age of Onset  . Colon cancer Sister     deceased age 30   Social History  Substance Use Topics  . Smoking status: Former Smoker -- 0.50 packs/day for .3 years    Types: Cigarettes    Start date: 06/04/1946    Quit date: 06/03/1949  . Smokeless tobacco: Never Used     Comment: as teenager  . Alcohol Use: No    Review of Systems  Constitutional: Negative for appetite change.  Cardiovascular: Negative for chest pain.  Gastrointestinal: Negative for abdominal pain.  Genitourinary: Negative for dysuria.  Musculoskeletal: Positive for back pain.  Neurological: Negative for weakness and numbness.    Allergies  Penicillins  Home Medications   Prior to Admission medications   Medication Sig Start Date End Date Taking? Authorizing Provider  aspirin 81 MG tablet Take 81 mg by mouth daily.     Yes Historical Provider, MD  atorvastatin (LIPITOR) 80 MG tablet Take 80 mg by mouth daily.   Yes Historical Provider, MD  benazepril (LOTENSIN) 10 MG tablet TAKE 1 TABLET (10 MG TOTAL) BY MOUTH EVERY EVENING. 12/25/14  Yes Laqueta Linden, MD  gabapentin (NEURONTIN) 100 MG capsule Take 100 mg by mouth 3 (three) times daily.  03/21/13  Yes Historical Provider, MD  glipiZIDE (GLUCOTROL) 5 MG tablet Take 5 mg by mouth daily before breakfast.  03/31/13  Yes Historical  Provider, MD  metFORMIN (GLUMETZA) 500 MG (MOD) 24 hr tablet Take 500-1,000 mg by mouth 3 (three) times daily. 2 tablets in the morning, 1 tablet at noon, and 1 tablet in the evening.   Yes Historical Provider, MD  naproxen sodium (ALEVE) 220 MG tablet Take 220 mg by mouth daily.   Yes Historical Provider, MD  pantoprazole (PROTONIX) 40 MG tablet Take 40 mg by mouth 2 (two) times daily.    Yes Historical Provider, MD   HYDROcodone-acetaminophen (NORCO/VICODIN) 5-325 MG tablet Take 0.5-1 tablets by mouth every 4 (four) hours as needed for moderate pain. 04/21/15   Burgess AmorJulie Ilana Prezioso, PA-C   BP 182/60 mmHg  Pulse 65  Temp(Src) 98.9 F (37.2 C)  Resp 16  Ht 5\' 3"  (1.6 m)  Wt 160 lb (72.576 kg)  BMI 28.35 kg/m2  SpO2 98% Physical Exam  Constitutional: He appears well-developed and well-nourished.  HENT:  Head: Normocephalic.  Eyes: Conjunctivae are normal.  Neck: Normal range of motion. Neck supple.  Cardiovascular: Normal rate and intact distal pulses.   Pedal pulses normal.  Pulmonary/Chest: Effort normal.  Abdominal: Soft. Bowel sounds are normal. He exhibits no distension and no mass.  Musculoskeletal: Normal range of motion. He exhibits no edema.       Lumbar back: He exhibits tenderness. He exhibits no swelling, no edema and no spasm.  Neurological: He is alert. He has normal strength. He displays no atrophy and no tremor. No sensory deficit. Gait normal.  Reflex Scores:      Patellar reflexes are 2+ on the right side and 2+ on the left side.      Achilles reflexes are 2+ on the right side and 2+ on the left side. No strength deficit noted in hip and knee flexor and extensor muscle groups.  Ankle flexion and extension intact.  Skin: Skin is warm and dry.  Psychiatric: He has a normal mood and affect.  Nursing note and vitals reviewed.   ED Course  Procedures (including critical care time) DIAGNOSTIC STUDIES: Oxygen Saturation is 98% on RA, normal by my interpretation.    COORDINATION OF CARE: 4:24 PM-Discussed treatment plan which includes DG spine lumbar with pt at bedside and pt agreed to plan.   Labs Review Labs Reviewed  URINALYSIS, ROUTINE W REFLEX MICROSCOPIC (NOT [facility]) - Abnormal; Notable for the following:    Glucose, UA >1000 (*)    All other components within normal limits  URINE MICROSCOPIC-ADD ON - Abnormal; Notable for the following:    Squamous Epithelial / LPF 0-5 (*)     Bacteria, UA RARE (*)    All other components within normal limits    Imaging Review Dg Lumbar Spine Complete  04/21/2015  CLINICAL DATA:  Lumbago for 1 month, progressing. Left-sided radicular symptoms EXAM: LUMBAR SPINE - COMPLETE 4+ VIEW COMPARISON:  None. FINDINGS: Frontal lateral, spot lumbosacral lateral, and bilateral oblique views were obtained. There are 5 non-rib-bearing lumbar type vertebral bodies. There is no fracture or spondylolisthesis. There are anterior osteophytes at all levels in the lower thoracic and lumbar region. Disc spaces appear normal. There is facet osteoarthritic change at L5-S1 bilaterally. IMPRESSION: Facet osteoarthritic change at L5-S1 bilaterally. Anterior osteophytes at multiple levels. No fracture or spondylolisthesis. Electronically Signed   By: Bretta BangWilliam  Woodruff III M.D.   On: 04/21/2015 17:51   I have personally reviewed and evaluated these images and lab results as part of my medical decision-making.   EKG Interpretation None  MDM   Final diagnoses:  Left-sided low back pain without sciatica    Pt with reproducible low back pain, positional.  Pt also seen by Dr. Clayborne Dana, abd US performed with no sign of enlarged aorta. He was placed on hydrocodone, advised 1/2 to 1 tab q 4 hr prn and cautioned re sedation. May continue aleve. Heat tx. Plan f/u with pcp for a recheck if not improving over the next 5 days.  No neuro deficit on exam or by history to suggest emergent or surgical presentation.  Discussed worsened sx that should prompt immediate re-evaluation including distal weakness, bowel/bladder retention/incontinence.      I personally performed the services described in this documentation, which was scribed in my presence. The recorded information has been reviewed and is accurate.   Burgess Amor, PA-C 04/23/15 2242  Marily Memos, MD 04/24/15 [phone]

## 2015-10-28 ENCOUNTER — Emergency Department (HOSPITAL_COMMUNITY)
Admission: EM | Admit: 2015-10-28 | Discharge: 2015-10-28 | Disposition: A | Discharge status: Home / Self Care | Payer: Medicare Other | Attending: Emergency Medicine | Admitting: Emergency Medicine

## 2015-10-28 ENCOUNTER — Emergency Department (HOSPITAL_COMMUNITY): Payer: Medicare Other

## 2015-10-28 ENCOUNTER — Encounter (HOSPITAL_COMMUNITY): Payer: Self-pay | Admitting: Emergency Medicine

## 2015-10-28 DIAGNOSIS — I10 Essential (primary) hypertension: Secondary | ICD-10-CM | POA: Insufficient documentation

## 2015-10-28 DIAGNOSIS — Z7982 Long term (current) use of aspirin: Secondary | ICD-10-CM | POA: Insufficient documentation

## 2015-10-28 DIAGNOSIS — E119 Type 2 diabetes mellitus without complications: Secondary | ICD-10-CM | POA: Diagnosis not present

## 2015-10-28 DIAGNOSIS — M5431 Sciatica, right side: Secondary | ICD-10-CM

## 2015-10-28 DIAGNOSIS — I2510 Atherosclerotic heart disease of native coronary artery without angina pectoris: Secondary | ICD-10-CM | POA: Insufficient documentation

## 2015-10-28 DIAGNOSIS — Z955 Presence of coronary angioplasty implant and graft: Secondary | ICD-10-CM | POA: Diagnosis not present

## 2015-10-28 DIAGNOSIS — Z7984 Long term (current) use of oral hypoglycemic drugs: Secondary | ICD-10-CM | POA: Diagnosis not present

## 2015-10-28 DIAGNOSIS — Z87891 Personal history of nicotine dependence: Secondary | ICD-10-CM | POA: Insufficient documentation

## 2015-10-28 DIAGNOSIS — M5441 Lumbago with sciatica, right side: Secondary | ICD-10-CM | POA: Insufficient documentation

## 2015-10-28 DIAGNOSIS — M545 Low back pain: Secondary | ICD-10-CM | POA: Diagnosis present

## 2015-10-28 DIAGNOSIS — Z79899 Other long term (current) drug therapy: Secondary | ICD-10-CM | POA: Insufficient documentation

## 2015-10-28 MED ORDER — TRAMADOL HCL 50 MG PO TABS: 50.0000 mg | ORAL_TABLET | Freq: Four times a day (QID) | ORAL | 0 refills | Status: DC | PRN

## 2015-10-28 NOTE — ED Provider Notes (Signed)
[facility] Provider Note   CSN: [id number] Arrival date & time: 10/28/15  1635     History   Chief Complaint Chief Complaint  Patient presents with  . Back Pain    HPI Christopher Fields is a 81 y.o. male presenting with a 2 month history of slowly worsening right lower back and hip pain. He describes an aching pain that starts in his right posterior back and radiates to his upper posterior thigh.  His pain is constant but worsened with certain positions.  He had similar pain on his left but resolved after treatment by his pcp with a medication (which he does not recall the name).  There has been no weakness or numbness in the lower extremities and no urinary or bowel retention or incontinence.  Patient does not have a history of cancer or IVDU.  The patient has tried ibuprofen 200 mg daily with some relief of symptoms. .  The history is provided by the patient.    Past Medical History:  Diagnosis Date  . CAD (coronary artery disease)   . Diabetes mellitus   . GERD (gastroesophageal reflux disease)   . Hypertension     Patient Active Problem List   Diagnosis Date Noted  . Esophageal dysphagia 04/17/2013  . Personal history of colonic polyps 04/17/2013  . Dysphagia 01/01/2011  . FH: colon cancer 01/01/2011  . ALLERGIC RHINITIS 02/06/2009  . DIABETES MELLITUS, TYPE II, UNCONTROLLED 12/07/2007  . DECREASED HEARING 09/21/2007  . BENIGN PROSTATIC HYPERTROPHY, WITH OBSTRUCTION 07/15/2006  . HYPERLIPIDEMIA 12/28/2005  . HYPERTENSION 12/28/2005  . CORONARY ARTERY DISEASE 12/28/2005  . GERD 12/28/2005    Past Surgical History:  Procedure Laterality Date  . COLONOSCOPY  07/01/06   normal colon colonic mucosa appeared normal  . CORONARY ANGIOPLASTY WITH STENT PLACEMENT  2007  . ESOPHAGOGASTRODUODENOSCOPY  01/21/2011   RMR: Hiatal hernia. 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 Gastric erosions-status post biopsy (no h.pyori)  .  ESOPHAGOGASTRODUODENOSCOPY (EGD) WITH ESOPHAGEAL DILATION N/A 05/02/2013   Procedure: ESOPHAGOGASTRODUODENOSCOPY (EGD) WITH ESOPHAGEAL DILATION;  Surgeon: Corbin Adeobert M Rourk, MD;  Location: [facility];  Service: Endoscopy;  Laterality: N/A;  11:00       Home Medications    Prior to Admission medications   Medication Sig Start Date End Date Taking? Authorizing Provider  aspirin 81 MG tablet Take 81 mg by mouth daily.     Yes Historical Provider, MD  atorvastatin (LIPITOR) 80 MG tablet Take 80 mg by mouth daily.   Yes Historical Provider, MD  gabapentin (NEURONTIN) 100 MG capsule Take 100 mg by mouth 3 (three) times daily.  03/21/13  Yes Historical Provider, MD  glipiZIDE (GLUCOTROL) 5 MG tablet Take 5 mg by mouth daily before breakfast.  03/31/13  Yes Historical Provider, MD  ibuprofen (ADVIL,MOTRIN) 200 MG tablet Take 200 mg by mouth every 6 (six) hours as needed for mild pain or moderate pain.   Yes Historical Provider, MD  metFORMIN (GLUMETZA) 500 MG (MOD) 24 hr tablet Take 500-1,000 mg by mouth 3 (three) times daily. 2 tablets in the morning, 1 tablet at noon, and 1 tablet in the evening.   Yes Historical Provider, MD  pantoprazole (PROTONIX) 40 MG tablet Take 40 mg by mouth 2 (two) times daily.    Yes Historical Provider, MD  traMADol (ULTRAM) 50 MG tablet Take 1 tablet (50 mg total) by mouth every 6 (six) hours as needed. 10/28/15   Burgess AmorJulie Alejandro Gamel, PA-C    Family History Family History  Problem Relation Age of Onset  . Colon cancer Sister     deceased age 74    Social History Social History  Substance Use Topics  . Smoking status: Former Smoker    Packs/day: 0.50    Years: 0.30    Types: Cigarettes    Start date: 06/04/1946    Quit date: 06/03/1949  . Smokeless tobacco: Never Used     Comment: as teenager  . Alcohol use No     Allergies   Penicillins   Review of Systems Review of Systems  Constitutional: Negative for fever.  Respiratory: Negative for shortness of breath.     Cardiovascular: Negative for chest pain and leg swelling.  Gastrointestinal: Negative for abdominal distention, abdominal pain and constipation.  Genitourinary: Negative for difficulty urinating, dysuria, flank pain, frequency and urgency.  Musculoskeletal: Positive for back pain. Negative for gait problem and joint swelling.  Skin: Negative for rash.  Neurological: Negative for weakness and numbness.     Physical Exam Updated Vital Signs BP 169/64 (BP Location: Left Arm)   Pulse 78   Temp 98.7 F (37.1 C) (Oral)   Resp 20   Ht 5\' 4"  (1.626 m)   Wt 72.6 kg   SpO2 98%   BMI 27.46 kg/m   Physical Exam  Constitutional: He appears well-developed and well-nourished.  HENT:  Head: Normocephalic.  Eyes: Conjunctivae are normal.  Neck: Normal range of motion. Neck supple.  Cardiovascular: Normal rate and intact distal pulses.   Pedal pulses normal.  Pulmonary/Chest: Effort normal.  Abdominal: Soft. Bowel sounds are normal. He exhibits no distension and no mass.  Musculoskeletal: Normal range of motion. He exhibits no edema.       Lumbar back: He exhibits tenderness. He exhibits no swelling, no edema and no spasm.  ttp right paralumbar without midline pain.  Neurological: He is alert. He has normal strength. He displays no atrophy and no tremor. No sensory deficit. Gait normal.  Reflex Scores:      Patellar reflexes are 2+ on the right side and 2+ on the left side.      Achilles reflexes are 2+ on the right side and 2+ on the left side. No strength deficit noted in hip and knee flexor and extensor muscle groups.  Ankle flexion and extension intact.  Skin: Skin is warm and dry.  Psychiatric: He has a normal mood and affect.  Nursing note and vitals reviewed.    ED Treatments / Results  Labs (all labs ordered are listed, but only abnormal results are displayed) Labs Reviewed - No data to display  EKG  EKG Interpretation None       Radiology Dg Lumbar Spine  Complete  Result Date: 10/28/2015 CLINICAL DATA:  Initial evaluation for lower back pain radiating to right hip and right thigh for 1 month. No injury. EXAM: LUMBAR SPINE - COMPLETE 4+ VIEW COMPARISON:  None. FINDINGS: Five non rib-bearing lumbar type vertebral bodies are present. Vertebral bodies normally aligned with preservation of the normal lumbar lordosis paravertebral body heights are preserved. No acute fracture or subluxation. The prominent bridging bulky anterior osteophytic spurring again seen at multiple levels, most prevalent at L5-S1. Degenerative osteoarthrosis noted at L5-S1 as well. Soft tissues within normal limits. Scattered atheromatous plaque noted within the intra-abdominal aorta. IMPRESSION: 1. No acute abnormality within the lumbar spine. 2. Bulky anterior osteophytic spurring throughout the lumbar spine. 3. Degenerative facet arthropathy at L5-S1. Electronically Signed   By: Janell Quiet.D.  On: 10/28/2015 18:00   Dg Hip Unilat W Or W/o Pelvis 2-3 Views Right  Result Date: 10/28/2015 CLINICAL DATA:  Right hip pain radiating into the lower back and down right leg for 1 month. No known injury. EXAM: DG HIP (WITH OR WITHOUT PELVIS) 2-3V RIGHT COMPARISON:  None. FINDINGS: The cortical margins of the bony pelvis and right hip are intact. No fracture. Pubic symphysis and sacroiliac joints are congruent. Both femoral heads are well-seated in the respective acetabula. No significant joint space narrowing. IMPRESSION: Negative radiographs of the pelvis and right hip. Electronically Signed   By: Rubye Oaks M.D.   On: 10/28/2015 21:03    Procedures Procedures (including critical care time)  Medications Ordered in ED Medications - No data to display   Initial Impression / Assessment and Plan / ED Course  I have reviewed the triage vital signs and the nursing notes.  Pertinent labs & imaging results that were available during my care of the patient were reviewed by  me and considered in my medical decision making (see chart for details).  Clinical Course    Imaging reviewed and discussed.  Ultram prescribed, heat tx.  Plan f/u with pcp if sx persist or worsen.   No neuro deficit on exam or by history to suggest emergent or surgical presentation.  Discussed worsened sx that should prompt immediate re-evaluation including distal weakness, bowel/bladder retention/incontinence. Pt was seen by Dr. Rush Landmark prior to dc home.        Final Clinical Impressions(s) / ED Diagnoses   Final diagnoses:  Sciatica of right side    New Prescriptions New Prescriptions   TRAMADOL (ULTRAM) 50 MG TABLET    Take 1 tablet (50 mg total) by mouth every 6 (six) hours as needed.     Burgess Amor, PA-C 10/28/15 2154    Canary Brim Tegeler, MD 10/29/15 1452

## 2015-10-28 NOTE — Discharge Instructions (Signed)
You may take the medicine prescribed for your pain in addition to continuing to take your ibuprofen (take 1 tablet up to 3 times daily with food), but stop taking if you develop any stomach upset or heartburn like symptoms.  Apply a heating pad to your lower back and right hip area for 20 minutes several times daily.  Use caution with taking the pain medicine prescribed as this can make you sleepy.  Do not drive within 4 hours of taking this medicine.

## 2015-10-28 NOTE — ED Triage Notes (Signed)
Pt reports R side back/hip pain with radiation down his leg.

## 2015-12-08 ENCOUNTER — Other Ambulatory Visit (HOSPITAL_COMMUNITY): Payer: Self-pay | Admitting: Family Medicine

## 2015-12-08 ENCOUNTER — Ambulatory Visit (HOSPITAL_COMMUNITY)
Admission: RE | Admit: 2015-12-08 | Discharge: 2015-12-08 | Disposition: A | Discharge status: Home / Self Care | Payer: Medicare Other | Source: Ambulatory Visit | Attending: Family Medicine | Admitting: Family Medicine

## 2015-12-08 DIAGNOSIS — M7989 Other specified soft tissue disorders: Secondary | ICD-10-CM

## 2015-12-08 DIAGNOSIS — M79641 Pain in right hand: Secondary | ICD-10-CM

## 2015-12-08 DIAGNOSIS — M25519 Pain in unspecified shoulder: Secondary | ICD-10-CM | POA: Diagnosis present

## 2016-01-21 ENCOUNTER — Other Ambulatory Visit: Payer: Self-pay | Admitting: Orthopedic Surgery

## 2016-01-23 ENCOUNTER — Ambulatory Visit (INDEPENDENT_AMBULATORY_CARE_PROVIDER_SITE_OTHER): Payer: Medicare Other | Admitting: Cardiovascular Disease

## 2016-01-23 ENCOUNTER — Encounter: Payer: Self-pay | Admitting: Cardiovascular Disease

## 2016-01-23 VITALS — BP 121/71 | HR 106 | Ht 63.0 in | Wt 148.0 lb

## 2016-01-23 DIAGNOSIS — E7800 Pure hypercholesterolemia, unspecified: Secondary | ICD-10-CM

## 2016-01-23 DIAGNOSIS — I2510 Atherosclerotic heart disease of native coronary artery without angina pectoris: Secondary | ICD-10-CM | POA: Diagnosis not present

## 2016-01-23 DIAGNOSIS — I10 Essential (primary) hypertension: Secondary | ICD-10-CM

## 2016-01-23 DIAGNOSIS — Z01818 Encounter for other preprocedural examination: Secondary | ICD-10-CM

## 2016-01-23 NOTE — Patient Instructions (Signed)

## 2016-01-23 NOTE — Progress Notes (Signed)
SUBJECTIVE: The patient is a 82 year old male who I last evaluated in April 2015. He has a history of hypertension, diabetes mellitus, and hyperlipidemia, as well as coronary artery disease. He previously underwent balloon angioplasty for ostial stenosis of a diagonal branch and stenting of the proximal LAD. He has known 60-70% ostial PDA stenosis and 30% mid RCA stenosis.  He is being scheduled for right carpal tunnel release.  Normal nuclear stress test on 05/18/13, LVEF 58%.  Echocardiogram 05/09/13: Normal left ventricle systolic function, EF 6065%, grade 1 diastolic dysfunction, mild mitral regurgitation.  ECG performed today shows sinus tachycardia, 108 bpm.  He denies chest pain, shortness of breath, and palpitations. He has mild episodic dizziness. He walks in the church parking [place] for exercise without difficulty.   Review of Systems: As per "subjective", otherwise negative.  Allergies  Allergen Reactions  . Penicillins Itching and Rash    Current Outpatient Prescriptions  Medication Sig Dispense Refill  . aspirin 81 MG tablet Take 81 mg by mouth daily.      Marland Kitchen atorvastatin (LIPITOR) 80 MG tablet Take 80 mg by mouth daily.    Marland Kitchen gabapentin (NEURONTIN) 100 MG capsule Take 100 mg by mouth 3 (three) times daily.     Marland Kitchen glipiZIDE (GLUCOTROL) 5 MG tablet Take 5 mg by mouth daily before breakfast.     . metFORMIN (GLUMETZA) 500 MG (MOD) 24 hr tablet Take 500-1,000 mg by mouth 3 (three) times daily. 2 tablets in the morning, 1 tablet at noon, and 1 tablet in the evening.    . pantoprazole (PROTONIX) 40 MG tablet Take 40 mg by mouth 2 (two) times daily.      No current facility-administered medications for this visit.     Past Medical History:  Diagnosis Date  . CAD (coronary artery disease)   . Diabetes mellitus   . GERD (gastroesophageal reflux disease)   . Hypertension     Past Surgical History:  Procedure Laterality Date  . COLONOSCOPY  07/01/06   normal colon  colonic mucosa appeared normal  . CORONARY ANGIOPLASTY WITH STENT PLACEMENT  2007  . ESOPHAGOGASTRODUODENOSCOPY  01/21/2011   RMR: Hiatal hernia. 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 Gastric erosions-status post biopsy (no h.pyori)  . ESOPHAGOGASTRODUODENOSCOPY (EGD) WITH ESOPHAGEAL DILATION N/A 05/02/2013   Procedure: ESOPHAGOGASTRODUODENOSCOPY (EGD) WITH ESOPHAGEAL DILATION;  Surgeon: [name], MD;  Location: [facility];  Service: Endoscopy;  Laterality: N/A;  11:00    Social History   Social History  . Marital status: Widowed    Spouse name: N/A  . Number of children: N/A  . Years of education: N/A   Occupational History  . Not on file.   Social History Main Topics  . Smoking status: Former Smoker    Packs/day: 0.50    Years: 0.30    Types: Cigarettes    Start date: 06/04/1946    Quit date: 06/03/1949  . Smokeless tobacco: Never Used     Comment: as teenager  . Alcohol use No  . Drug use: No  . Sexual activity: Not on file   Other Topics Concern  . Not on file   Social History Narrative  . No narrative on file     Vitals:   01/23/16 1550  BP: 121/71  Pulse: (!) 106  Weight: 148 lb (67.1 kg)  Height: 5\' 3"  (1.6 m)    PHYSICAL EXAM General: NAD HEENT: Normal. Neck: No JVD, no thyromegaly. Lungs: Clear to auscultation  bilaterally with normal respiratory effort. CV: Nondisplaced PMI.  Mildly tachycardic, regular rhythm, normal S1/S2, no S3/S4, no murmur. No pretibial or periankle edema.  No carotid bruit.   Abdomen: Soft, nontender, no distention.  Neurologic: Alert and oriented.  Psych: Normal affect. Skin: Normal. Musculoskeletal: No gross deformities.    ECG: Most recent ECG reviewed.      ASSESSMENT AND PLAN: 1. CAD: Symptomatically stable. Continue aspirin and Lipitor.  2. Hypertension: Controlled without medications. No changes.  3. Hyperlipidemia: Continue Lipitor 80 mg.  4. Preoperative risk stratification: He  is to undergo IV regional forearm block for right carpal tunnel release. This is a low risk surgery. No preoperative noninvasive cardiovascular testing is required.  Dispo: fu 1 yr.  Prentice DockerSuresh Kerrilyn Azbill, M.D., F.A.C.C.

## 2016-02-18 ENCOUNTER — Encounter (HOSPITAL_BASED_OUTPATIENT_CLINIC_OR_DEPARTMENT_OTHER): Payer: Self-pay | Admitting: *Deleted

## 2016-02-20 ENCOUNTER — Encounter (HOSPITAL_BASED_OUTPATIENT_CLINIC_OR_DEPARTMENT_OTHER)
Admission: RE | Admit: 2016-02-20 | Discharge: 2016-02-20 | Disposition: A | Discharge status: Home / Self Care | Payer: Medicare Other | Source: Ambulatory Visit | Attending: Orthopedic Surgery | Admitting: Orthopedic Surgery

## 2016-02-20 DIAGNOSIS — Z01812 Encounter for preprocedural laboratory examination: Secondary | ICD-10-CM | POA: Diagnosis present

## 2016-02-20 LAB — BASIC METABOLIC PANEL
ANION GAP: 9 (ref 5–15)
BUN: 12 mg/dL (ref 6–20)
CALCIUM: 8.5 mg/dL — AB (ref 8.9–10.3)
CO2: 28 mmol/L (ref 22–32)
Chloride: 102 mmol/L (ref 101–111)
Creatinine, Ser: 0.82 mg/dL (ref 0.61–1.24)
GFR calc Af Amer: >60 mL/min (ref >60)
Glucose, Bld: 146 mg/dL — ABNORMAL HIGH (ref 65–99)
Potassium: 3.3 mmol/L — ABNORMAL LOW (ref 3.5–5.1)
SODIUM: 139 mmol/L (ref 135–145)

## 2016-02-20 NOTE — Progress Notes (Signed)
Dr. Renold DonGermeroth notified of K+ 3.3 and glucose 146. Called Dr. Brett CanalesSteve Knowlton's office to get previous labs to check K+. Dr. Sudie BaileyKnowlton out of the office from [place]. 31st to Feb.6 th, Dr. Karilyn CotaGosrani covering. Called Dr. Patty SermonsGosrani's office to see if they have access to this pt's labs, left message. Notified Dr. Renold DonGermeroth of the above and he said ok, do not worry about it.

## 2016-02-24 ENCOUNTER — Ambulatory Visit (HOSPITAL_BASED_OUTPATIENT_CLINIC_OR_DEPARTMENT_OTHER)
Admission: RE | Admit: 2016-02-24 | Discharge: 2016-02-24 | Disposition: A | Discharge status: Home / Self Care | Payer: Medicare Other | Source: Ambulatory Visit | Attending: Orthopedic Surgery | Admitting: Orthopedic Surgery

## 2016-02-24 ENCOUNTER — Ambulatory Visit (HOSPITAL_BASED_OUTPATIENT_CLINIC_OR_DEPARTMENT_OTHER): Payer: Medicare Other | Admitting: Anesthesiology

## 2016-02-24 ENCOUNTER — Encounter (HOSPITAL_BASED_OUTPATIENT_CLINIC_OR_DEPARTMENT_OTHER)
Admission: RE | Disposition: A | Discharge status: Home / Self Care | Payer: Self-pay | Source: Ambulatory Visit | Attending: Orthopedic Surgery

## 2016-02-24 ENCOUNTER — Encounter (HOSPITAL_BASED_OUTPATIENT_CLINIC_OR_DEPARTMENT_OTHER): Payer: Self-pay | Admitting: *Deleted

## 2016-02-24 DIAGNOSIS — K219 Gastro-esophageal reflux disease without esophagitis: Secondary | ICD-10-CM | POA: Insufficient documentation

## 2016-02-24 DIAGNOSIS — E119 Type 2 diabetes mellitus without complications: Secondary | ICD-10-CM | POA: Diagnosis not present

## 2016-02-24 DIAGNOSIS — Z7982 Long term (current) use of aspirin: Secondary | ICD-10-CM | POA: Diagnosis not present

## 2016-02-24 DIAGNOSIS — I10 Essential (primary) hypertension: Secondary | ICD-10-CM | POA: Insufficient documentation

## 2016-02-24 DIAGNOSIS — G5601 Carpal tunnel syndrome, right upper limb: Secondary | ICD-10-CM | POA: Diagnosis present

## 2016-02-24 DIAGNOSIS — Z955 Presence of coronary angioplasty implant and graft: Secondary | ICD-10-CM | POA: Diagnosis not present

## 2016-02-24 DIAGNOSIS — Z79899 Other long term (current) drug therapy: Secondary | ICD-10-CM | POA: Insufficient documentation

## 2016-02-24 DIAGNOSIS — Z880 Allergy status to penicillin: Secondary | ICD-10-CM | POA: Insufficient documentation

## 2016-02-24 DIAGNOSIS — Z87891 Personal history of nicotine dependence: Secondary | ICD-10-CM | POA: Diagnosis not present

## 2016-02-24 DIAGNOSIS — I2510 Atherosclerotic heart disease of native coronary artery without angina pectoris: Secondary | ICD-10-CM | POA: Diagnosis not present

## 2016-02-24 DIAGNOSIS — G4730 Sleep apnea, unspecified: Secondary | ICD-10-CM | POA: Diagnosis not present

## 2016-02-24 DIAGNOSIS — Z7984 Long term (current) use of oral hypoglycemic drugs: Secondary | ICD-10-CM | POA: Diagnosis not present

## 2016-02-24 HISTORY — PX: CARPAL TUNNEL RELEASE: SHX101

## 2016-02-24 HISTORY — DX: Sleep apnea, unspecified: G47.30

## 2016-02-24 LAB — GLUCOSE, CAPILLARY
GLUCOSE-CAPILLARY: 105 mg/dL — AB (ref 65–99)
GLUCOSE-CAPILLARY: 105 mg/dL — AB (ref 65–99)

## 2016-02-24 SURGERY — CARPAL TUNNEL RELEASE
Anesthesia: Monitor Anesthesia Care | Site: Wrist | Laterality: Right

## 2016-02-24 MED ORDER — SUCCINYLCHOLINE CHLORIDE 200 MG/10ML IV SOSY
PREFILLED_SYRINGE | INTRAVENOUS | Status: AC
  Filled 2016-02-24: qty 10

## 2016-02-24 MED ORDER — ONDANSETRON HCL 4 MG/2ML IJ SOLN
INTRAMUSCULAR | Status: AC
  Filled 2016-02-24: qty 2

## 2016-02-24 MED ORDER — SCOPOLAMINE 1 MG/3DAYS TD PT72: 1.0000 | MEDICATED_PATCH | Freq: Once | TRANSDERMAL | Status: DC | PRN

## 2016-02-24 MED ORDER — MIDAZOLAM HCL 2 MG/2ML IJ SOLN
1.0000 mg | INTRAMUSCULAR | Status: DC | PRN
  Administered 2016-02-24: 0.5 mg via INTRAVENOUS

## 2016-02-24 MED ORDER — EPHEDRINE 5 MG/ML INJ
INTRAVENOUS | Status: AC
  Filled 2016-02-24: qty 10

## 2016-02-24 MED ORDER — OXYCODONE HCL 5 MG PO TABS: 5.0000 mg | ORAL_TABLET | Freq: Once | ORAL | Status: DC | PRN

## 2016-02-24 MED ORDER — BUPIVACAINE HCL (PF) 0.25 % IJ SOLN
INTRAMUSCULAR | Status: DC | PRN
  Administered 2016-02-24: 8 mL

## 2016-02-24 MED ORDER — MIDAZOLAM HCL 2 MG/2ML IJ SOLN
INTRAMUSCULAR | Status: AC
  Filled 2016-02-24: qty 2

## 2016-02-24 MED ORDER — PHENYLEPHRINE 40 MCG/ML (10ML) SYRINGE FOR IV PUSH (FOR BLOOD PRESSURE SUPPORT)
PREFILLED_SYRINGE | INTRAVENOUS | Status: AC
  Filled 2016-02-24: qty 10

## 2016-02-24 MED ORDER — CHLORHEXIDINE GLUCONATE 4 % EX LIQD: 60.0000 mL | Freq: Once | CUTANEOUS | Status: DC

## 2016-02-24 MED ORDER — CLINDAMYCIN PHOSPHATE 900 MG/50ML IV SOLN
INTRAVENOUS | Status: AC
  Filled 2016-02-24: qty 50

## 2016-02-24 MED ORDER — HYDROCODONE-ACETAMINOPHEN 5-325 MG PO TABS: 1.0000 | ORAL_TABLET | Freq: Four times a day (QID) | ORAL | 0 refills | Status: DC | PRN

## 2016-02-24 MED ORDER — 0.9 % SODIUM CHLORIDE (POUR BTL) OPTIME
TOPICAL | Status: DC | PRN
  Administered 2016-02-24: 180 mL

## 2016-02-24 MED ORDER — LIDOCAINE 2% (20 MG/ML) 5 ML SYRINGE
INTRAMUSCULAR | Status: AC
  Filled 2016-02-24: qty 5

## 2016-02-24 MED ORDER — FENTANYL CITRATE (PF) 100 MCG/2ML IJ SOLN: 25.0000 ug | INTRAMUSCULAR | Status: DC | PRN

## 2016-02-24 MED ORDER — CLINDAMYCIN PHOSPHATE 900 MG/50ML IV SOLN
900.0000 mg | INTRAVENOUS | Status: AC
  Administered 2016-02-24: 900 mg via INTRAVENOUS

## 2016-02-24 MED ORDER — OXYCODONE HCL 5 MG/5ML PO SOLN: 5.0000 mg | Freq: Once | ORAL | Status: DC | PRN

## 2016-02-24 MED ORDER — PROPOFOL 500 MG/50ML IV EMUL
INTRAVENOUS | Status: AC
  Filled 2016-02-24: qty 50

## 2016-02-24 MED ORDER — PROPOFOL 10 MG/ML IV BOLUS
INTRAVENOUS | Status: DC | PRN
  Administered 2016-02-24 (×2): 10 mg via INTRAVENOUS

## 2016-02-24 MED ORDER — FENTANYL CITRATE (PF) 100 MCG/2ML IJ SOLN
INTRAMUSCULAR | Status: AC
  Filled 2016-02-24: qty 2

## 2016-02-24 MED ORDER — FENTANYL CITRATE (PF) 100 MCG/2ML IJ SOLN
50.0000 ug | INTRAMUSCULAR | Status: DC | PRN
  Administered 2016-02-24: 50 ug via INTRAVENOUS

## 2016-02-24 MED ORDER — ONDANSETRON HCL 4 MG/2ML IJ SOLN: 4.0000 mg | Freq: Four times a day (QID) | INTRAMUSCULAR | Status: DC | PRN

## 2016-02-24 MED ORDER — LIDOCAINE HCL (PF) 0.5 % IJ SOLN
INTRAMUSCULAR | Status: DC | PRN
  Administered 2016-02-24: 30 mL via INTRAVENOUS

## 2016-02-24 MED ORDER — ONDANSETRON HCL 4 MG/2ML IJ SOLN
INTRAMUSCULAR | Status: DC | PRN
Start: 2016-02-24 — End: 2016-02-24
  Administered 2016-02-24: 4 mg via INTRAVENOUS

## 2016-02-24 MED ORDER — LACTATED RINGERS IV SOLN
INTRAVENOUS | Status: DC
  Administered 2016-02-24 (×2): via INTRAVENOUS

## 2016-02-24 SURGICAL SUPPLY — 36 items
BLADE SURG 15 STRL LF DISP TIS (BLADE) ×1 IMPLANT
BLADE SURG 15 STRL SS (BLADE) ×3
BNDG CMPR 9X4 STRL LF SNTH (GAUZE/BANDAGES/DRESSINGS) ×1
BNDG COHESIVE 3X5 TAN STRL LF (GAUZE/BANDAGES/DRESSINGS) ×3 IMPLANT
BNDG ESMARK 4X9 LF (GAUZE/BANDAGES/DRESSINGS) ×2 IMPLANT
BNDG GAUZE ELAST 4 BULKY (GAUZE/BANDAGES/DRESSINGS) ×3 IMPLANT
CHLORAPREP W/TINT 26ML (MISCELLANEOUS) ×3 IMPLANT
CORDS BIPOLAR (ELECTRODE) ×3 IMPLANT
COVER BACK TABLE 60X90IN (DRAPES) ×3 IMPLANT
COVER MAYO STAND STRL (DRAPES) ×3 IMPLANT
CUFF TOURNIQUET SINGLE 18IN (TOURNIQUET CUFF) ×3 IMPLANT
DRAPE EXTREMITY T 121X128X90 (DRAPE) ×3 IMPLANT
DRAPE SURG 17X23 STRL (DRAPES) ×3 IMPLANT
DRSG PAD ABDOMINAL 8X10 ST (GAUZE/BANDAGES/DRESSINGS) ×3 IMPLANT
GAUZE SPONGE 4X4 12PLY STRL (GAUZE/BANDAGES/DRESSINGS) ×3 IMPLANT
GAUZE XEROFORM 1X8 LF (GAUZE/BANDAGES/DRESSINGS) ×3 IMPLANT
GLOVE BIOGEL PI IND STRL 7.0 (GLOVE) IMPLANT
GLOVE BIOGEL PI IND STRL 8.5 (GLOVE) ×1 IMPLANT
GLOVE BIOGEL PI INDICATOR 7.0 (GLOVE) ×6
GLOVE BIOGEL PI INDICATOR 8.5 (GLOVE) ×2
GLOVE SURG ORTHO 8.0 STRL STRW (GLOVE) ×3 IMPLANT
GLOVE SURG SS PI 7.5 STRL IVOR (GLOVE) ×2 IMPLANT
GOWN STRL REUS W/ TWL LRG LVL3 (GOWN DISPOSABLE) ×1 IMPLANT
GOWN STRL REUS W/TWL LRG LVL3 (GOWN DISPOSABLE) ×3
GOWN STRL REUS W/TWL XL LVL3 (GOWN DISPOSABLE) ×3 IMPLANT
NDL PRECISIONGLIDE 27X1.5 (NEEDLE) IMPLANT
NEEDLE PRECISIONGLIDE 27X1.5 (NEEDLE) ×3 IMPLANT
NS IRRIG 1000ML POUR BTL (IV SOLUTION) ×3 IMPLANT
PACK BASIN DAY SURGERY FS (CUSTOM PROCEDURE TRAY) ×3 IMPLANT
STOCKINETTE 4X48 STRL (DRAPES) ×3 IMPLANT
SUT ETHILON 4 0 PS 2 18 (SUTURE) ×3 IMPLANT
SUT VICRYL 4-0 PS2 18IN ABS (SUTURE) IMPLANT
SYR BULB 3OZ (MISCELLANEOUS) ×3 IMPLANT
SYR CONTROL 10ML LL (SYRINGE) ×2 IMPLANT
TOWEL OR 17X24 6PK STRL BLUE (TOWEL DISPOSABLE) ×3 IMPLANT
UNDERPAD 30X30 (UNDERPADS AND DIAPERS) ×3 IMPLANT

## 2016-02-24 NOTE — Op Note (Signed)
NAMSherilyn Banker:  Christopher Fields, [name]             ACCOUNT NO.:  [id number]  MEDICAL RECORD NO.:  [id number]  LOCATION:                                 FACILITY:  PHYSICIAN:  Cindee SaltGary Christeen Lai, M.D.       DATE OF BIRTH:  04-04-1934  DATE OF PROCEDURE:  02/24/2016 DATE OF DISCHARGE:                              OPERATIVE REPORT   PREOPERATIVE DIAGNOSIS:  Carpal tunnel syndrome, right hand.  POSTOPERATIVE DIAGNOSIS:  Carpal tunnel syndrome, right hand.  OPERATION:  Decompression of right median nerve.  SURGEON:  Cindee SaltGary Mykeria Garman, M.D.  ANESTHESIA:  Forearm-based IV regional with local infiltration.  [facility]  HISTORY:  The patient is a 82 year old male with significant carpal tunnel syndrome.  EMG nerve conduction is positive, which has not responded to conservative treatment.  He has elected to undergo surgical decompression to the median nerve, right hand.  Pre, peri, and postoperative course have been discussed along with risks and complications.  He is aware that there is no guarantee to the surgery; the possibility of infection; recurrence of injury to arteries, nerves, tendons; incomplete relief of symptoms; and dystrophy.  In preoperative area, the patient was seen, the extremity marked by both the patient and surgeon.  Antibiotic given.  PROCEDURE IN DETAIL:  The patient was brought to the operating room, where a forearm-based IV regional anesthetic was carried out without difficulty under the direction of the Anesthesia Department.  He was prepped using ChloraPrep in a supine position with the right arm free. A 3-minute dry time was allowed, and a time-out taken, confirming the patient and procedure.  A longitudinal incision was made in the right palm.  This was carried down through subcutaneous tissue.  Bleeders were electrocauterized with bipolar.  The palmar fascia was split.  The superficial palmar arch was identified distally.  The flexor tendon to the  ring and little fingers were identified.  Retractors were placed retracting the median nerve radially and ulnar nerve ulnarly.  The flexor retinaculum was then incised on its ulnar border using sharp dissection.  A right-angle and Sewall retractor were placed between skin and forearm fascia.  The underlying tissue was dissected free.  The overlying tissue was also dissected free.  A blunt scissors were then used to cut the remainder of the flexor retinaculum and the distal forearm fascia for approximately 2 cm proximal to the wrist crease under direct vision.  The canal was explored.  An area of compression to the nerve was apparent.  A motor branch entered into muscle distally.  The wound was copiously irrigated with saline.  The skin was then closed with interrupted 4-0 nylon sutures.  Local infiltration with 0.25% bupivacaine without epinephrine was given.  The 8 mL was used.  A sterile compressive dressing with fingers free and thumb free are applied.  On deflation of the tourniquet, all fingers immediately pinked.  He was taken to the recovery room for observation in satisfactory condition.  He will be discharged home to [facility], on [place].          ______________________________ Cindee SaltGary Verenis Nicosia, M.D.  GK/MEDQ  D:  02/24/2016  T:  02/24/2016  Job:  [id number]

## 2016-02-24 NOTE — Anesthesia Postprocedure Evaluation (Addendum)
Anesthesia Post Note  Patient: Christopher Fields  Procedure(s) Performed: Procedure(s) (LRB): RIGHT CARPAL TUNNEL RELEASE (Right)  Patient location during evaluation: PACU Anesthesia Type: MAC Level of consciousness: awake and alert Pain management: pain level controlled Vital Signs Assessment: post-procedure vital signs reviewed and stable Respiratory status: spontaneous breathing, nonlabored ventilation, respiratory function stable and patient connected to nasal cannula oxygen Cardiovascular status: stable and blood pressure returned to baseline Anesthetic complications: no       Last Vitals:  Vitals:   02/24/16 0915 02/24/16 0930  BP: (!) 153/70 (!) 157/69  Pulse: 67 70  Resp: 12 13  Temp:      Last Pain:  Vitals:   02/24/16 0930  TempSrc:   PainSc: 0-No pain                 Dorisann Schwanke S

## 2016-02-24 NOTE — H&P (Signed)
Christopher Fields is an 82 y.o. male.   Chief Complaint: tingling in fingers HPI: Christopher NajjarLarry is a 82 year old right-hand-dominant male comes in with a complaint of pain in his right hand. This been going approximately 1 month. He recalls no history of injury. He is a very difficult historian. He has not had any treatment for this. He complains of some occasional discomfort in his shoulder going down to his hand and also some discomfort in his neck. He has no history of injury to the hand or to the neck. States he will occasionally awaken him at night. States that is like pins and needles. He has a history of diabetes and arthritis no history of thyroid problems or gout. His family history is negative for each of these . Nerve conductions were performed by Dr. Riccardo DubinKarvelas. These revealed no response to the motor component of the median nerve on his right side and no response to the sensory component of the median nerve on his right side. His left median nerve shows a motor delay of 5.20 and a sensory delay of 4.53. There is a right median nerve is 15 mm at the wrist.          Past Medical History:  Diagnosis Date  . CAD (coronary artery disease)   . Diabetes mellitus   . GERD (gastroesophageal reflux disease)   . Hypertension   . Sleep apnea    Has never used his machine    Past Surgical History:  Procedure Laterality Date  . COLONOSCOPY  07/01/06   normal colon colonic mucosa appeared normal  . CORONARY ANGIOPLASTY WITH STENT PLACEMENT  2007  . ESOPHAGOGASTRODUODENOSCOPY  01/21/2011   RMR: Hiatal hernia. 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 Gastric erosions-status post biopsy (no h.pyori)  . ESOPHAGOGASTRODUODENOSCOPY (EGD) WITH ESOPHAGEAL DILATION N/A 05/02/2013   Procedure: ESOPHAGOGASTRODUODENOSCOPY (EGD) WITH ESOPHAGEAL DILATION;  Surgeon: Corbin Adeobert M Rourk, MD;  Location: [facility];  Service: Endoscopy;  Laterality: N/A;  11:00    Family History  Problem Relation Age  of Onset  . Colon cancer Sister     deceased age 80   Social History:  reports that he quit smoking about 66 years ago. His smoking use included Cigarettes. He started smoking about 69 years ago. He has a 0.15 pack-year smoking history. He has never used smokeless tobacco. He reports that he does not drink alcohol or use drugs.  Allergies:  Allergies  Allergen Reactions  . Penicillins Itching and Rash    Medications Prior to Admission  Medication Sig Dispense Refill  . aspirin 81 MG tablet Take 81 mg by mouth daily.      Marland Kitchen. atorvastatin (LIPITOR) 80 MG tablet Take 80 mg by mouth daily.    Marland Kitchen. gabapentin (NEURONTIN) 100 MG capsule Take 100 mg by mouth 3 (three) times daily.     Marland Kitchen. glipiZIDE (GLUCOTROL) 5 MG tablet Take 5 mg by mouth daily before breakfast.     . metFORMIN (GLUMETZA) 500 MG (MOD) 24 hr tablet Take 500-1,000 mg by mouth 3 (three) times daily. 2 tablets in the morning, 1 tablet at noon, and 1 tablet in the evening.    . pantoprazole (PROTONIX) 40 MG tablet Take 40 mg by mouth 2 (two) times daily.       Results for orders placed or performed during the hospital encounter of 02/24/16 (from the past 48 hour(s))  Glucose, capillary     Status: Abnormal   Collection Time: 02/24/16  7:57 AM  Result  Value Ref Range   Glucose-Capillary 105 (H) 65 - 99 mg/dL    No results found.   Pertinent items are noted in HPI.  Blood pressure (!) 184/77, pulse (!) 58, temperature 98.1 F (36.7 C), temperature source Oral, resp. rate 18, height 5\' 2"  (1.575 m), weight 65.3 kg (144 lb), SpO2 98 %.  General appearance: alert, cooperative and appears stated age Head: Normocephalic, without obvious abnormality Neck: no JVD Resp: clear to auscultation bilaterally Cardio: regular rate and rhythm, S1, S2 normal, no murmur, click, rub or gallop GI: soft, non-tender; bowel sounds normal; no masses,  no organomegaly Extremities: numbness hands Pulses: 2+ and symmetric Skin: Skin color, texture,  turgor normal. No rashes or lesions Neurologic: Grossly normal Incision/Wound: na  Assessment/Plan Assessment:  1. Primary osteoarthritis of first carpometacarpal joint of right hand  2. Bilateral carpal tunnel syndrome    Plan: Discussed possibility of surgical decompression of the median nerve right side. Pre-peri-and postoperative course are discussed along with risks and complications. He is aware that there is no guarantee to the surgery the possibility of infection recurrence injury to arteries nerves tendons incomplete release symptoms and dystrophy. Questions are encouraged and answered. He would like to proceed. He is scheduled for right carpal tunnel release and outpatient under regional anesthesia.      Elianie Hubers R 02/24/2016, 8:25 AM

## 2016-02-24 NOTE — Brief Op Note (Signed)
02/24/2016  9:12 AM  PATIENT:  Christopher Fields  82 y.o. male  PRE-OPERATIVE DIAGNOSIS:  RIGHT CARPAL TUNNEL SYNDROME  POST-OPERATIVE DIAGNOSIS:  RIGHT CARPAL TUNNEL SYNDROME  PROCEDURE:  Procedure(s): RIGHT CARPAL TUNNEL RELEASE (Right)  SURGEON:  Surgeon(s) and Role:    * Cindee SaltGary Sonakshi Rolland, MD - Primary  PHYSICIAN ASSISTANT:   ASSISTANTS: none   ANESTHESIA:   local and regional  EBL:  Total I/O In: 300 [I.V.:300] Out: 10 [Blood:10]  BLOOD ADMINISTERED:none  DRAINS: none   LOCAL MEDICATIONS USED:  BUPIVICAINE   SPECIMEN:  No Specimen  DISPOSITION OF SPECIMEN:  N/A  COUNTS:  YES  TOURNIQUET:   Total Tourniquet Time Documented: Forearm (Right) - 20 minutes Total: Forearm (Right) - 20 minutes   DICTATION: .Other Dictation: Dictation Number [id number]  PLAN OF CARE: Discharge to home after PACU  PATIENT DISPOSITION:  PACU - hemodynamically stable.

## 2016-02-24 NOTE — Op Note (Signed)
Dictation Number [id number]

## 2016-02-24 NOTE — Discharge Instructions (Addendum)

## 2016-02-24 NOTE — Anesthesia Preprocedure Evaluation (Signed)
Anesthesia Evaluation  Patient identified by MRN, date of birth, ID band Patient awake    Reviewed: Allergy & Precautions, H&P , NPO status , Patient's Chart, lab work & pertinent test results  Airway Mallampati: II   Neck ROM: full    Dental   Pulmonary sleep apnea , former smoker,    breath sounds clear to auscultation       Cardiovascular hypertension, + CAD and + Cardiac Stents   Rhythm:regular Rate:Normal  PTCA and stent in 2007.  Normal stress test 2015.  Cleared by his primary care physician for surgery.  CV symptoms stable.   Neuro/Psych    GI/Hepatic GERD  ,  Endo/Other  diabetes, Type 2  Renal/GU      Musculoskeletal   Abdominal   Peds  Hematology   Anesthesia Other Findings   Reproductive/Obstetrics                             Anesthesia Physical Anesthesia Plan  ASA: III  Anesthesia Plan: Bier Block and MAC   Post-op Pain Management:    Induction: Intravenous  Airway Management Planned: Simple Face Mask  Additional Equipment:   Intra-op Plan:   Post-operative Plan:   Informed Consent: I have reviewed the patients History and Physical, chart, labs and discussed the procedure including the risks, benefits and alternatives for the proposed anesthesia with the patient or authorized representative who has indicated his/her understanding and acceptance.     Plan Discussed with: CRNA, Anesthesiologist and Surgeon  Anesthesia Plan Comments:         Anesthesia Quick Evaluation

## 2016-02-24 NOTE — Transfer of Care (Signed)
Immediate Anesthesia Transfer of Care Note  Patient: Christopher Fields  Procedure(s) Performed: Procedure(s): RIGHT CARPAL TUNNEL RELEASE (Right)  Patient Location: PACU  Anesthesia Type:MAC and Bier block  Level of Consciousness: awake, alert  and oriented  Airway & Oxygen Therapy: Patient Spontanous Breathing and Patient connected to face mask oxygen  Post-op Assessment: Report given to RN and Post -op Vital signs reviewed and stable  Post vital signs: Reviewed and stable  Last Vitals:  Vitals:   02/24/16 0754  BP: (!) 184/77  Pulse: (!) 58  Resp: 18  Temp: 36.7 C    Last Pain:  Vitals:   02/24/16 0754  TempSrc: Oral         Complications: No apparent anesthesia complications

## 2016-02-25 ENCOUNTER — Encounter (HOSPITAL_BASED_OUTPATIENT_CLINIC_OR_DEPARTMENT_OTHER): Payer: Self-pay | Admitting: Orthopedic Surgery

## 2016-03-15 ENCOUNTER — Other Ambulatory Visit (HOSPITAL_COMMUNITY): Payer: Self-pay | Admitting: Orthopedic Surgery

## 2016-03-15 DIAGNOSIS — M7989 Other specified soft tissue disorders: Secondary | ICD-10-CM

## 2016-03-16 ENCOUNTER — Ambulatory Visit (HOSPITAL_COMMUNITY)
Admission: RE | Admit: 2016-03-16 | Discharge: 2016-03-16 | Disposition: A | Discharge status: Home / Self Care | Payer: Medicare Other | Source: Ambulatory Visit | Attending: Vascular Surgery | Admitting: Vascular Surgery

## 2016-03-16 DIAGNOSIS — M7989 Other specified soft tissue disorders: Secondary | ICD-10-CM | POA: Diagnosis not present

## 2016-09-08 ENCOUNTER — Encounter (HOSPITAL_BASED_OUTPATIENT_CLINIC_OR_DEPARTMENT_OTHER): Payer: Self-pay | Admitting: Orthopedic Surgery

## 2016-09-08 NOTE — Addendum Note (Signed)
Addendum  created 09/08/16 1143 by Brettney Ficken, MD   Sign clinical note    

## 2016-12-03 ENCOUNTER — Telehealth: Payer: Self-pay | Admitting: Cardiovascular Disease

## 2016-12-03 NOTE — Telephone Encounter (Signed)
Call placed to Dr. Merrilee SeashoreKuzma's office - she will fax clearance for today.

## 2016-12-03 NOTE — Telephone Encounter (Signed)
Mr. Christopher Fields walked into office stating that he is scheduled [facility] Dr[facility] Cindee SaltGary Kuzma [facility].  Patient was told that he would need a clearance from Dr.Koneswaran . Office # [phone]  Fax # [phone]

## 2016-12-06 NOTE — Telephone Encounter (Signed)
Attempted to contact patient - not available at home number & fast busy on mobile.

## 2016-12-07 ENCOUNTER — Other Ambulatory Visit: Payer: Self-pay | Admitting: Orthopedic Surgery

## 2016-12-07 NOTE — Telephone Encounter (Signed)
Attempted to contact patient again.  Home number not available & mobile number still has fast busy signal.

## 2016-12-08 NOTE — Telephone Encounter (Signed)
Clearance form placed in Dr. Junius ArgyleKoneswaran's box on desk.  He will be in [place] on Monday, 12/13/2016.

## 2016-12-13 NOTE — Telephone Encounter (Signed)
Clearance form signed & faxed back to [facility] today.

## 2017-01-09 IMAGING — CR DG CHEST 2V
2 series · 2 of 2 positions shown · non-contrast
Comparison: None.

CLINICAL DATA: Pain and swelling.

EXAM:
CHEST  2 VIEW

[w chest pa]
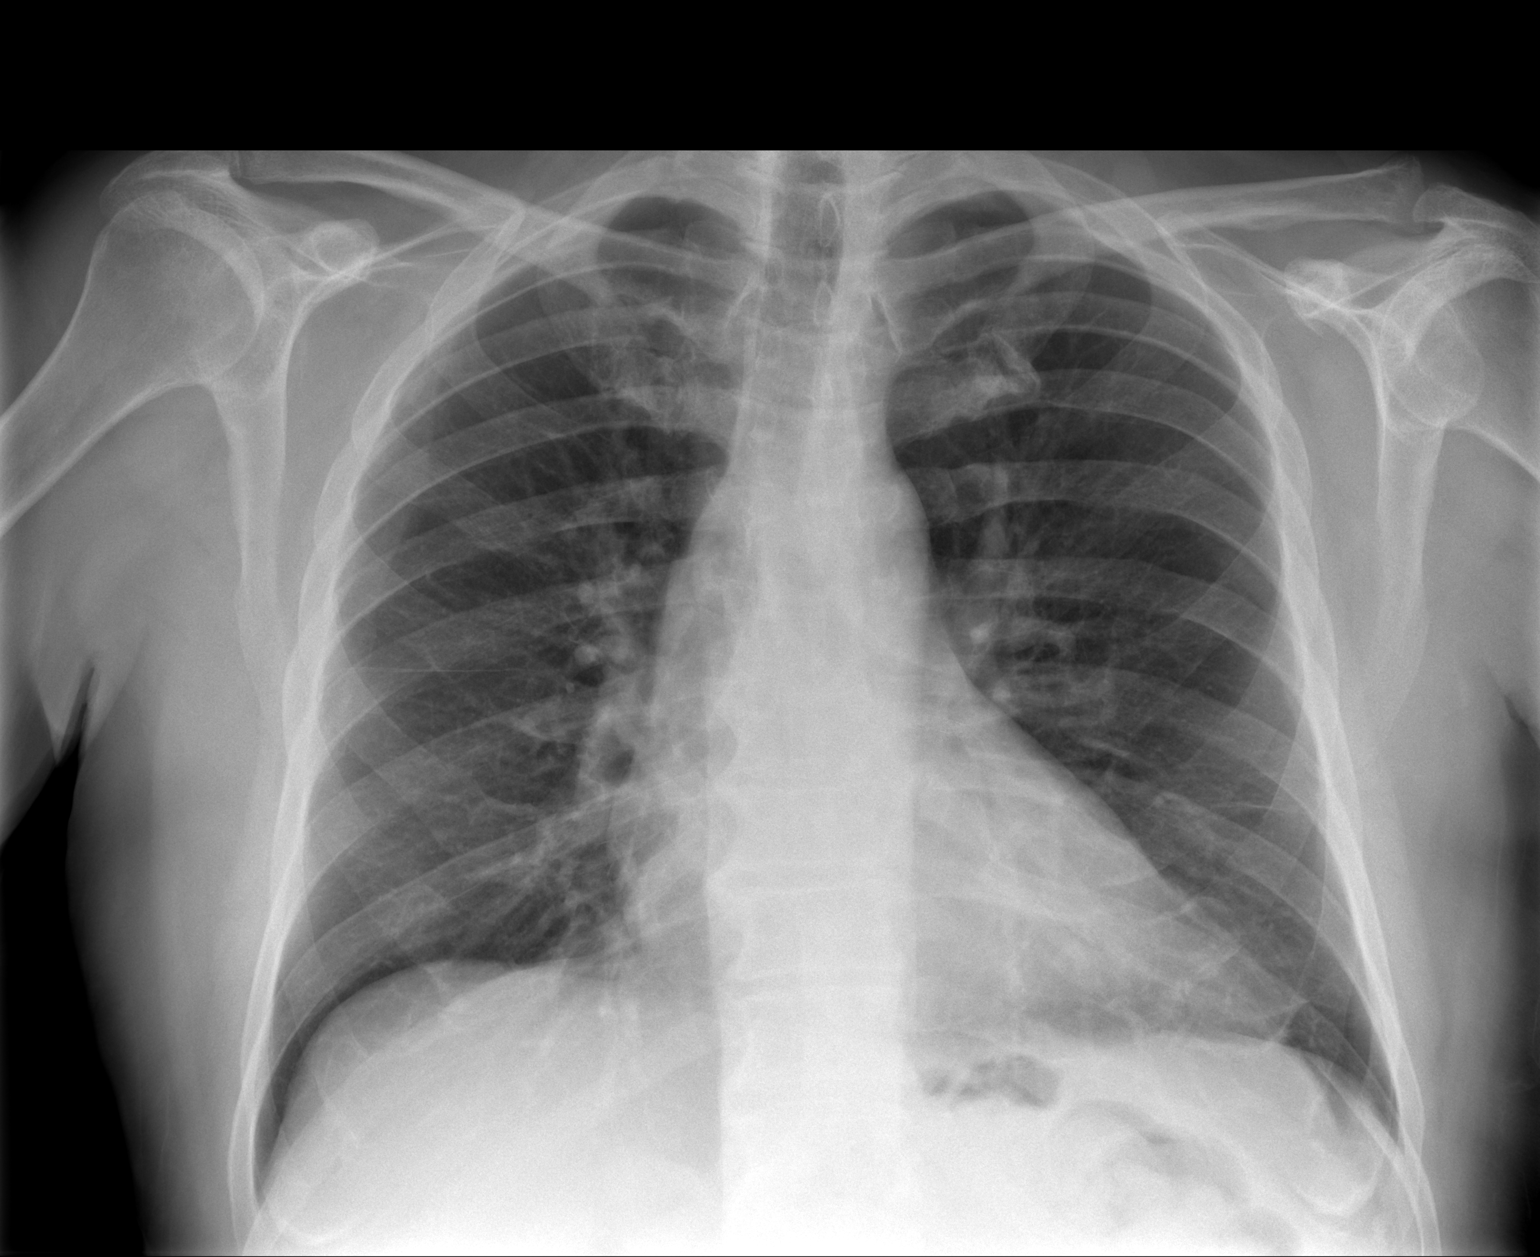

[w chest lat]
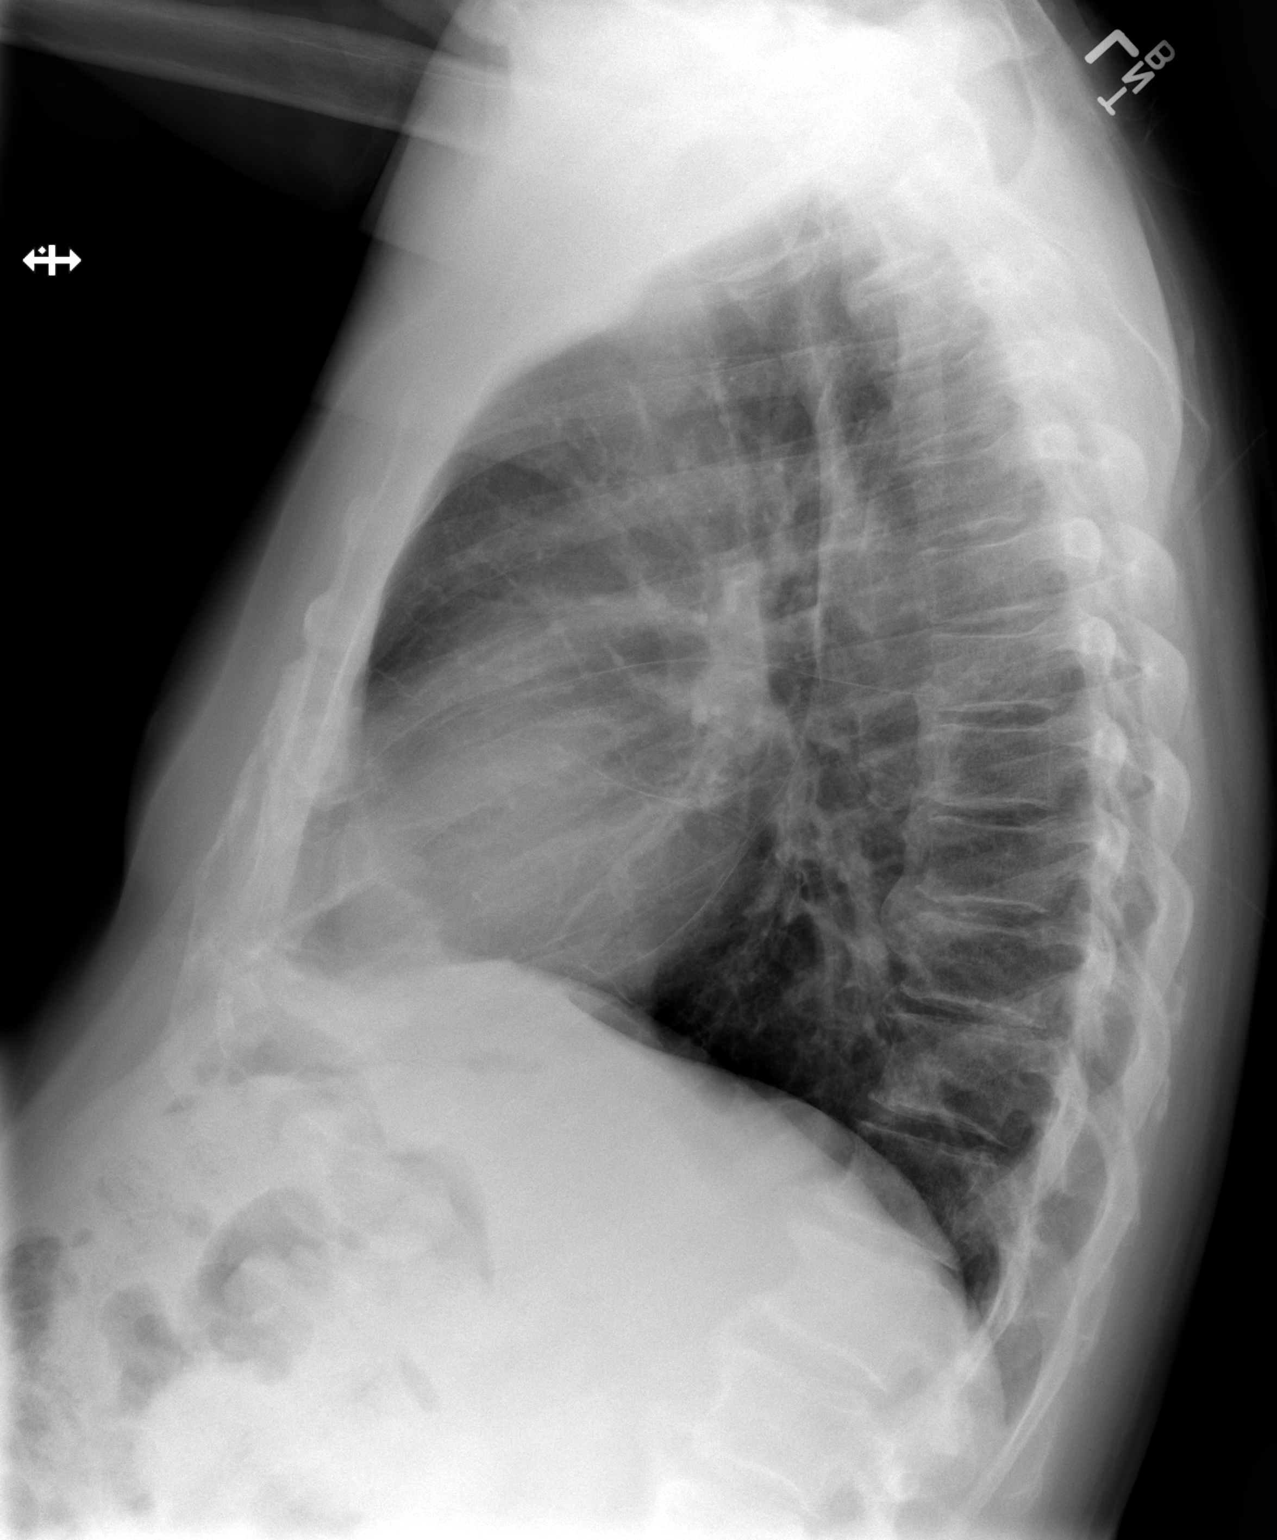

[2 of 2 positions shown; findings below may reference images not displayed]

FINDINGS: The heart size and mediastinal contours are within normal limits.
Both lungs are clear. The visualized skeletal structures are
unremarkable.
IMPRESSION: No active cardiopulmonary disease.

## 2017-01-26 ENCOUNTER — Encounter: Payer: Self-pay | Admitting: Cardiovascular Disease

## 2017-01-26 ENCOUNTER — Ambulatory Visit: Payer: Medicare Other | Admitting: Cardiovascular Disease

## 2017-01-26 VITALS — BP 150/58 | HR 68 | Ht 62.5 in | Wt 156.0 lb

## 2017-01-26 DIAGNOSIS — I25118 Atherosclerotic heart disease of native coronary artery with other forms of angina pectoris: Secondary | ICD-10-CM | POA: Diagnosis not present

## 2017-01-26 DIAGNOSIS — I10 Essential (primary) hypertension: Secondary | ICD-10-CM

## 2017-01-26 DIAGNOSIS — E7800 Pure hypercholesterolemia, unspecified: Secondary | ICD-10-CM | POA: Diagnosis not present

## 2017-01-26 NOTE — Progress Notes (Signed)
SUBJECTIVE: The patient presents for annual follow-up. He has a history of hypertension, diabetes mellitus, and hyperlipidemia, as well as coronary artery disease. He previously underwent balloon angioplasty for ostial stenosis of a diagonal branch and stenting of the proximal LAD. He has known 60-70% ostial PDA stenosis and 30% mid RCA stenosis.  Normal nuclear stress test on 05/18/13, LVEF 58%.  Echocardiogram 05/09/13: Normal left ventricular systolic function, EF 60-65%, grade 1 diastolic dysfunction, mild mitral regurgitation.  ECG performed today which I personally interpreted demonstrated sinus rhythm with possible old inferior and possible old anteroseptal infarct.  He is doing well.  He occasionally has left infra axillary pain which only occurs at rest and never with exertion.  He has occasional "twitches "on the left side of his chest.  He has no associated shortness of breath.  When he walks to the mailbox he sometimes staggers but denies falls.  Blood pressure is elevated today, 150/58.  He said it is normal at his PCPs office.  He said he has some difficulty sleeping but he watches TV too late at night.  He enjoys talking about God and the miracles He has done in his life and a healing which occurred when he was a child.    Review of Systems: As per "subjective", otherwise negative.  Allergies  Allergen Reactions  . Penicillins Itching and Rash    Current Outpatient Medications  Medication Sig Dispense Refill  . aspirin 81 MG tablet Take 81 mg by mouth daily.      Marland Kitchen atorvastatin (LIPITOR) 80 MG tablet Take 80 mg by mouth daily.    Marland Kitchen gabapentin (NEURONTIN) 100 MG capsule Take 100 mg by mouth 3 (three) times daily.     Marland Kitchen glipiZIDE (GLUCOTROL) 5 MG tablet Take 5 mg by mouth daily before breakfast.     . HYDROcodone-acetaminophen (NORCO) 5-325 MG tablet Take 1 tablet by mouth every 6 (six) hours as needed for moderate pain. 20 tablet 0  . metFORMIN (GLUMETZA) 500 MG  (MOD) 24 hr tablet Take 500-1,000 mg by mouth 3 (three) times daily. 2 tablets in the morning, 1 tablet at noon, and 1 tablet in the evening.    . pantoprazole (PROTONIX) 40 MG tablet Take 40 mg by mouth 2 (two) times daily.      No current facility-administered medications for this visit.     Past Medical History:  Diagnosis Date  . CAD (coronary artery disease)   . Diabetes mellitus   . GERD (gastroesophageal reflux disease)   . Hypertension   . Sleep apnea    Has never used his machine    Past Surgical History:  Procedure Laterality Date  . CARPAL TUNNEL RELEASE Right 02/24/2016   Procedure: RIGHT CARPAL TUNNEL RELEASE;  Surgeon: Cindee Salt, MD;  Location:  SURGERY CENTER;  Service: Orthopedics;  Laterality: Right;  . COLONOSCOPY  07/01/06   normal colon colonic mucosa appeared normal  . CORONARY ANGIOPLASTY WITH STENT PLACEMENT  2007  . ESOPHAGOGASTRODUODENOSCOPY  01/21/2011   RMR: Hiatal hernia. 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 Gastric erosions-status post biopsy (no h.pyori)  . ESOPHAGOGASTRODUODENOSCOPY (EGD) WITH ESOPHAGEAL DILATION N/A 05/02/2013   Procedure: ESOPHAGOGASTRODUODENOSCOPY (EGD) WITH ESOPHAGEAL DILATION;  Surgeon: [name], MD;  [facility];  Service: Endoscopy;  Laterality: N/A;  11:00    Social History   Socioeconomic History  . Marital status: Widowed    Spouse name: Not on file  . Number of children: Not  on file  . Years of education: Not on file  . Highest education level: Not on file  Social Needs  . Financial resource strain: Not on file  . Food insecurity - worry: Not on file  . Food insecurity - inability: Not on file  . Transportation needs - medical: Not on file  . Transportation needs - non-medical: Not on file  Occupational History  . Not on file  Tobacco Use  . Smoking status: Former Smoker    Packs/day: 0.50    Years: 0.30    Pack years: 0.15    Types: Cigarettes    Start date:  06/04/1946    Last attempt to quit: 06/03/1949    Years since quitting: 67.6  . Smokeless tobacco: Never Used  . Tobacco comment: as teenager  Substance and Sexual Activity  . Alcohol use: No  . Drug use: No  . Sexual activity: Not on file  Other Topics Concern  . Not on file  Social History Narrative  . Not on file     Vitals:   01/26/17 1004  BP: (!) 150/58  Pulse: 68  SpO2: 98%  Weight: 156 lb (70.8 kg)  Height: 5' 2.5" (1.588 m)    Wt Readings from Last 3 Encounters:  01/26/17 156 lb (70.8 kg)  02/24/16 144 lb (65.3 kg)  01/23/16 148 lb (67.1 kg)     PHYSICAL EXAM General: NAD HEENT: Normal. Neck: No JVD, no thyromegaly. Lungs: Clear to auscultation bilaterally with normal respiratory effort. CV: Regular rate and rhythm, normal S1/S2, no S3/S4, no murmur. No pretibial or periankle edema.  No carotid bruit.   Abdomen: Soft, nontender, no distention.  Neurologic: Alert and oriented.  Psych: Normal affect. Skin: Normal. Musculoskeletal: No gross deformities.    ECG: Most recent ECG reviewed.   Labs: Lab Results  Component Value Date/Time   K 3.3 (L) 02/20/2016 11:52 AM   BUN 12 02/20/2016 11:52 AM   CREATININE 0.82 02/20/2016 11:52 AM   ALT 14 09/08/2007 02:44 AM   HGB 15.5 06/06/2007 10:05 PM     Lipids: Lab Results  Component Value Date/Time   LDLCALC 93 09/08/2007 02:44 AM   CHOL 158 09/08/2007 02:44 AM   TRIG 142 09/08/2007 02:44 AM   HDL 37 (L) 09/08/2007 02:44 AM       ASSESSMENT AND PLAN:  1. CAD: Symptomatically stable. Continue aspirin and Lipitor.  2. Hypertension: Elevated.  Renal function was normal in February 2018.  He said it has been normal at his PCPs office.  He needs continued monitoring of this.  If it remains elevated at his next visit, I will start lisinopril 5 mg daily.  3. Hyperlipidemia: Continue Lipitor 80 mg.  I will obtain a copy of lipids from his PCP.     Disposition: Follow up 1 year   Prentice DockerSuresh  Minard Millirons, M.D., F.A.C.C.

## 2017-01-26 NOTE — Patient Instructions (Signed)

## 2017-02-08 ENCOUNTER — Ambulatory Visit (HOSPITAL_BASED_OUTPATIENT_CLINIC_OR_DEPARTMENT_OTHER): Admission: RE | Admit: 2017-02-08 | Payer: Medicare Other | Source: Ambulatory Visit | Admitting: Orthopedic Surgery

## 2017-02-08 ENCOUNTER — Encounter (HOSPITAL_BASED_OUTPATIENT_CLINIC_OR_DEPARTMENT_OTHER): Admission: RE | Payer: Self-pay | Source: Ambulatory Visit

## 2017-02-08 SURGERY — CARPAL TUNNEL RELEASE
Anesthesia: Regional | Laterality: Left

## 2017-06-28 ENCOUNTER — Other Ambulatory Visit (HOSPITAL_COMMUNITY): Payer: Self-pay | Admitting: Family Medicine

## 2017-06-28 ENCOUNTER — Ambulatory Visit (HOSPITAL_COMMUNITY)
Admission: RE | Admit: 2017-06-28 | Discharge: 2017-06-28 | Disposition: A | Discharge status: Home / Self Care | Payer: Medicare Other | Source: Ambulatory Visit | Attending: Family Medicine | Admitting: Family Medicine

## 2017-06-28 DIAGNOSIS — R7989 Other specified abnormal findings of blood chemistry: Secondary | ICD-10-CM

## 2017-06-28 DIAGNOSIS — R945 Abnormal results of liver function studies: Secondary | ICD-10-CM | POA: Insufficient documentation

## 2018-02-07 ENCOUNTER — Other Ambulatory Visit (HOSPITAL_COMMUNITY): Payer: Self-pay | Admitting: Family Medicine

## 2018-02-07 ENCOUNTER — Ambulatory Visit (HOSPITAL_COMMUNITY)
Admission: RE | Admit: 2018-02-07 | Discharge: 2018-02-07 | Disposition: A | Discharge status: Home / Self Care | Payer: Medicare Other | Source: Ambulatory Visit | Attending: Family Medicine | Admitting: Family Medicine

## 2018-02-07 DIAGNOSIS — R05 Cough: Secondary | ICD-10-CM | POA: Insufficient documentation

## 2018-02-07 DIAGNOSIS — R059 Cough, unspecified: Secondary | ICD-10-CM

## 2018-08-24 ENCOUNTER — Inpatient Hospital Stay (HOSPITAL_COMMUNITY): Payer: Medicare Other

## 2018-08-24 ENCOUNTER — Encounter (HOSPITAL_COMMUNITY): Payer: Self-pay

## 2018-08-24 ENCOUNTER — Inpatient Hospital Stay (HOSPITAL_COMMUNITY)
Admission: EM | Admit: 2018-08-24 | Discharge: 2018-08-27 | DRG: 640 | Disposition: A | Discharge status: Home / Self Care | Payer: Medicare Other | Attending: Family Medicine | Admitting: Family Medicine

## 2018-08-24 ENCOUNTER — Other Ambulatory Visit: Payer: Self-pay

## 2018-08-24 DIAGNOSIS — Z20828 Contact with and (suspected) exposure to other viral communicable diseases: Secondary | ICD-10-CM | POA: Diagnosis present

## 2018-08-24 DIAGNOSIS — E119 Type 2 diabetes mellitus without complications: Secondary | ICD-10-CM | POA: Diagnosis present

## 2018-08-24 DIAGNOSIS — R6881 Early satiety: Secondary | ICD-10-CM | POA: Diagnosis not present

## 2018-08-24 DIAGNOSIS — R402362 Coma scale, best motor response, obeys commands, at arrival to emergency department: Secondary | ICD-10-CM | POA: Diagnosis present

## 2018-08-24 DIAGNOSIS — I4581 Long QT syndrome: Secondary | ICD-10-CM | POA: Diagnosis present

## 2018-08-24 DIAGNOSIS — R7989 Other specified abnormal findings of blood chemistry: Secondary | ICD-10-CM

## 2018-08-24 DIAGNOSIS — K449 Diaphragmatic hernia without obstruction or gangrene: Secondary | ICD-10-CM | POA: Diagnosis present

## 2018-08-24 DIAGNOSIS — E871 Hypo-osmolality and hyponatremia: Secondary | ICD-10-CM | POA: Diagnosis present

## 2018-08-24 DIAGNOSIS — R1314 Dysphagia, pharyngoesophageal phase: Secondary | ICD-10-CM | POA: Diagnosis present

## 2018-08-24 DIAGNOSIS — R945 Abnormal results of liver function studies: Secondary | ICD-10-CM | POA: Diagnosis present

## 2018-08-24 DIAGNOSIS — R402252 Coma scale, best verbal response, oriented, at arrival to emergency department: Secondary | ICD-10-CM | POA: Diagnosis present

## 2018-08-24 DIAGNOSIS — G4730 Sleep apnea, unspecified: Secondary | ICD-10-CM | POA: Diagnosis present

## 2018-08-24 DIAGNOSIS — K08109 Complete loss of teeth, unspecified cause, unspecified class: Secondary | ICD-10-CM | POA: Diagnosis present

## 2018-08-24 DIAGNOSIS — E876 Hypokalemia: Principal | ICD-10-CM | POA: Diagnosis present

## 2018-08-24 DIAGNOSIS — E785 Hyperlipidemia, unspecified: Secondary | ICD-10-CM | POA: Diagnosis present

## 2018-08-24 DIAGNOSIS — Z87891 Personal history of nicotine dependence: Secondary | ICD-10-CM

## 2018-08-24 DIAGNOSIS — T7401XA Adult neglect or abandonment, confirmed, initial encounter: Secondary | ICD-10-CM | POA: Diagnosis present

## 2018-08-24 DIAGNOSIS — Z880 Allergy status to penicillin: Secondary | ICD-10-CM

## 2018-08-24 DIAGNOSIS — R1310 Dysphagia, unspecified: Secondary | ICD-10-CM | POA: Diagnosis not present

## 2018-08-24 DIAGNOSIS — Q394 Esophageal web: Secondary | ICD-10-CM | POA: Diagnosis not present

## 2018-08-24 DIAGNOSIS — I10 Essential (primary) hypertension: Secondary | ICD-10-CM | POA: Diagnosis present

## 2018-08-24 DIAGNOSIS — Z79899 Other long term (current) drug therapy: Secondary | ICD-10-CM

## 2018-08-24 DIAGNOSIS — Z800 Family history of malignant neoplasm of digestive organs: Secondary | ICD-10-CM

## 2018-08-24 DIAGNOSIS — K219 Gastro-esophageal reflux disease without esophagitis: Secondary | ICD-10-CM | POA: Diagnosis present

## 2018-08-24 DIAGNOSIS — R112 Nausea with vomiting, unspecified: Secondary | ICD-10-CM

## 2018-08-24 DIAGNOSIS — R1110 Vomiting, unspecified: Secondary | ICD-10-CM | POA: Insufficient documentation

## 2018-08-24 DIAGNOSIS — H9190 Unspecified hearing loss, unspecified ear: Secondary | ICD-10-CM | POA: Diagnosis present

## 2018-08-24 DIAGNOSIS — Z6822 Body mass index (BMI) 22.0-22.9, adult: Secondary | ICD-10-CM

## 2018-08-24 DIAGNOSIS — E46 Unspecified protein-calorie malnutrition: Secondary | ICD-10-CM

## 2018-08-24 DIAGNOSIS — Z7982 Long term (current) use of aspirin: Secondary | ICD-10-CM

## 2018-08-24 DIAGNOSIS — Z7984 Long term (current) use of oral hypoglycemic drugs: Secondary | ICD-10-CM

## 2018-08-24 DIAGNOSIS — I2510 Atherosclerotic heart disease of native coronary artery without angina pectoris: Secondary | ICD-10-CM | POA: Diagnosis present

## 2018-08-24 DIAGNOSIS — R402142 Coma scale, eyes open, spontaneous, at arrival to emergency department: Secondary | ICD-10-CM | POA: Diagnosis present

## 2018-08-24 DIAGNOSIS — Z955 Presence of coronary angioplasty implant and graft: Secondary | ICD-10-CM

## 2018-08-24 DIAGNOSIS — Z79891 Long term (current) use of opiate analgesic: Secondary | ICD-10-CM

## 2018-08-24 DIAGNOSIS — F5089 Other specified eating disorder: Secondary | ICD-10-CM

## 2018-08-24 DIAGNOSIS — E860 Dehydration: Secondary | ICD-10-CM | POA: Diagnosis present

## 2018-08-24 LAB — CBC WITH DIFFERENTIAL/PLATELET
Abs Immature Granulocytes: 0.03 10*3/uL (ref 0.00–0.07)
Basophils Absolute: 0 10*3/uL (ref 0.0–0.1)
Basophils Relative: 1 %
Eosinophils Absolute: 0 10*3/uL (ref 0.0–0.5)
Eosinophils Relative: 0 %
HCT: 40.3 % (ref 39.0–52.0)
Hemoglobin: 14.4 g/dL (ref 13.0–17.0)
Immature Granulocytes: 0 %
Lymphocytes Relative: 14 %
Lymphs Abs: 1.2 10*3/uL (ref 0.7–4.0)
MCH: 29.3 pg (ref 26.0–34.0)
MCHC: 35.7 g/dL (ref 30.0–36.0)
MCV: 81.9 fL (ref 80.0–100.0)
Monocytes Absolute: 0.9 10*3/uL (ref 0.1–1.0)
Monocytes Relative: 10 %
Neutro Abs: 6.6 10*3/uL (ref 1.7–7.7)
Neutrophils Relative %: 75 %
Platelets: 232 10*3/uL (ref 150–400)
RBC: 4.92 MIL/uL (ref 4.22–5.81)
RDW: 12.7 % (ref 11.5–15.5)
WBC: 8.9 10*3/uL (ref 4.0–10.5)
nRBC: 0 % (ref 0.0–0.2)

## 2018-08-24 LAB — COMPREHENSIVE METABOLIC PANEL
ALT: 246 U/L — ABNORMAL HIGH (ref 0–44)
ALT: 221 U/L — ABNORMAL HIGH (ref 0–44)
AST: 268 U/L — ABNORMAL HIGH (ref 15–41)
AST: 237 U/L — ABNORMAL HIGH (ref 15–41)
Albumin: 3.4 g/dL — ABNORMAL LOW (ref 3.5–5.0)
Albumin: 3.2 g/dL — ABNORMAL LOW (ref 3.5–5.0)
Alkaline Phosphatase: 133 U/L — ABNORMAL HIGH (ref 38–126)
Alkaline Phosphatase: 118 U/L (ref 38–126)
Anion gap: 17 — ABNORMAL HIGH (ref 5–15)
Anion gap: 15 (ref 5–15)
BUN: 32 mg/dL — ABNORMAL HIGH (ref 8–23)
BUN: 30 mg/dL — ABNORMAL HIGH (ref 8–23)
CO2: 27 mmol/L (ref 22–32)
CO2: 26 mmol/L (ref 22–32)
Calcium: 8.3 mg/dL — ABNORMAL LOW (ref 8.9–10.3)
Calcium: 8 mg/dL — ABNORMAL LOW (ref 8.9–10.3)
Chloride: 85 mmol/L — ABNORMAL LOW (ref 98–111)
Chloride: 89 mmol/L — ABNORMAL LOW (ref 98–111)
Creatinine, Ser: 1.32 mg/dL — ABNORMAL HIGH (ref 0.61–1.24)
Creatinine, Ser: 1.11 mg/dL (ref 0.61–1.24)
GFR calc Af Amer: 58 mL/min — ABNORMAL LOW (ref >60)
GFR calc Af Amer: >60 mL/min (ref >60)
GFR calc non Af Amer: 50 mL/min — ABNORMAL LOW (ref >60)
GFR calc non Af Amer: >60 mL/min (ref >60)
Glucose, Bld: 220 mg/dL — ABNORMAL HIGH (ref 70–99)
Glucose, Bld: 190 mg/dL — ABNORMAL HIGH (ref 70–99)
Potassium: <2 mmol/L — CL (ref 3.5–5.1)
Potassium: 2.9 mmol/L — ABNORMAL LOW (ref 3.5–5.1)
Sodium: 129 mmol/L — ABNORMAL LOW (ref 135–145)
Sodium: 130 mmol/L — ABNORMAL LOW (ref 135–145)
Total Bilirubin: 2.4 mg/dL — ABNORMAL HIGH (ref 0.3–1.2)
Total Bilirubin: 2 mg/dL — ABNORMAL HIGH (ref 0.3–1.2)
Total Protein: 7.7 g/dL (ref 6.5–8.1)
Total Protein: 6.9 g/dL (ref 6.5–8.1)

## 2018-08-24 LAB — URINALYSIS, ROUTINE W REFLEX MICROSCOPIC
Bacteria, UA: NONE SEEN
Bilirubin Urine: NEGATIVE
Glucose, UA: 50 mg/dL — AB
Ketones, ur: 20 mg/dL — AB
Leukocytes,Ua: NEGATIVE
Nitrite: NEGATIVE
Protein, ur: 100 mg/dL — AB
Specific Gravity, Urine: 1.013 (ref 1.005–1.030)
pH: 6 (ref 5.0–8.0)

## 2018-08-24 LAB — MAGNESIUM
Magnesium: 2.6 mg/dL — ABNORMAL HIGH (ref 1.7–2.4)
Magnesium: 3 mg/dL — ABNORMAL HIGH (ref 1.7–2.4)

## 2018-08-24 LAB — HEMOGLOBIN A1C
Hgb A1c MFr Bld: 7.3 % — ABNORMAL HIGH (ref 4.8–5.6)
Mean Plasma Glucose: 162.81 mg/dL

## 2018-08-24 LAB — GLUCOSE, CAPILLARY
Glucose-Capillary: 176 mg/dL — ABNORMAL HIGH (ref 70–99)
Glucose-Capillary: 149 mg/dL — ABNORMAL HIGH (ref 70–99)

## 2018-08-24 LAB — CBG MONITORING, ED
Glucose-Capillary: 174 mg/dL — ABNORMAL HIGH (ref 70–99)
Glucose-Capillary: 185 mg/dL — ABNORMAL HIGH (ref 70–99)

## 2018-08-24 LAB — SARS CORONAVIRUS 2 (TAT 6-24 HRS): SARS Coronavirus 2: NEGATIVE

## 2018-08-24 MED ORDER — POTASSIUM CHLORIDE 10 MEQ/100ML IV SOLN
10.0000 meq | INTRAVENOUS | Status: AC
  Administered 2018-08-24 (×4): 10 meq via INTRAVENOUS
  Filled 2018-08-24 (×4): qty 100

## 2018-08-24 MED ORDER — ACETAMINOPHEN 325 MG PO TABS: 650.0000 mg | ORAL_TABLET | Freq: Four times a day (QID) | ORAL | Status: DC | PRN

## 2018-08-24 MED ORDER — MAGNESIUM SULFATE 2 GM/50ML IV SOLN
2.0000 g | Freq: Once | INTRAVENOUS | Status: AC
  Administered 2018-08-24: 2 g via INTRAVENOUS
  Filled 2018-08-24: qty 50

## 2018-08-24 MED ORDER — POTASSIUM CHLORIDE CRYS ER 20 MEQ PO TBCR
40.0000 meq | EXTENDED_RELEASE_TABLET | Freq: Once | ORAL | Status: AC
  Administered 2018-08-24: 40 meq via ORAL
  Filled 2018-08-24: qty 2

## 2018-08-24 MED ORDER — ACETAMINOPHEN 650 MG RE SUPP: 650.0000 mg | Freq: Four times a day (QID) | RECTAL | Status: DC | PRN

## 2018-08-24 MED ORDER — PANTOPRAZOLE SODIUM 40 MG IV SOLR
40.0000 mg | INTRAVENOUS | Status: DC
  Administered 2018-08-24 – 2018-08-26 (×3): 40 mg via INTRAVENOUS
  Filled 2018-08-24 (×3): qty 40

## 2018-08-24 MED ORDER — POTASSIUM CHLORIDE IN NACL 40-0.9 MEQ/L-% IV SOLN
INTRAVENOUS | Status: DC
  Administered 2018-08-24 (×2): 100 mL/h via INTRAVENOUS
  Administered 2018-08-25: 18:00:00 60 mL/h via INTRAVENOUS
  Administered 2018-08-26: 30 mL/h via INTRAVENOUS
  Filled 2018-08-24 (×5): qty 1000

## 2018-08-24 MED ORDER — ENOXAPARIN SODIUM 40 MG/0.4ML ~~LOC~~ SOLN
40.0000 mg | SUBCUTANEOUS | Status: DC
  Administered 2018-08-24: 40 mg via SUBCUTANEOUS
  Filled 2018-08-24: qty 0.4

## 2018-08-24 NOTE — H&P (View-Only) (Signed)
Tried to call patient's daughter to update on plan for upper endoscopy tomorrow. Left message for return call.   Will hold Lovenox planned for 08/25/18.  Deklyn Gibbon S. Junia Nygren, PA-C [facility] [phone] 8/6/20203:36 PM   

## 2018-08-24 NOTE — ED Notes (Signed)
Daughter, Felecia Jan, given update.

## 2018-08-24 NOTE — ED Provider Notes (Addendum)
[facility] EMERGENCY DEPARTMENT Provider Note   CSN: [id number] Arrival date & time: 08/24/18  0029    History   Chief Complaint Chief Complaint  Patient presents with  . decreased appetite    HPI Christopher Fields is a 84 y.o. male.     Patient brought to the emergency department by EMS.  He apparently had his nephew visit today who became concerned about his living conditions.  EMS report that they found him in very poor living conditions tonight.  He reports that he has not been eating for the last several weeks because he has a very poor appetite.  He does report that sometimes he vomits after he eats as well.  He is not experiencing any chest pain.  No cough, shortness of breath, fever, infectious symptoms.  Patient lives alone, daughter apparently lives some distance away and he does not have many family members to check on him.     Past Medical History:  Diagnosis Date  . CAD (coronary artery disease)   . Diabetes mellitus   . GERD (gastroesophageal reflux disease)   . Hypertension   . Sleep apnea    Has never used his machine    Patient Active Problem List   Diagnosis Date Noted  . Esophageal dysphagia 04/17/2013  . Personal history of colonic polyps 04/17/2013  . Dysphagia 01/01/2011  . FH: colon cancer 01/01/2011  . ALLERGIC RHINITIS 02/06/2009  . DIABETES MELLITUS, TYPE II, UNCONTROLLED 12/07/2007  . DECREASED HEARING 09/21/2007  . BENIGN PROSTATIC HYPERTROPHY, WITH OBSTRUCTION 07/15/2006  . HYPERLIPIDEMIA 12/28/2005  . Essential hypertension 12/28/2005  . CORONARY ARTERY DISEASE 12/28/2005  . GERD 12/28/2005    Past Surgical History:  Procedure Laterality Date  . CARPAL TUNNEL RELEASE Right 02/24/2016   Procedure: RIGHT CARPAL TUNNEL RELEASE;  Surgeon: Daryll Brod, MD;  Location: [place];  Service: Orthopedics;  Laterality: Right;  . COLONOSCOPY  07/01/06   normal colon colonic mucosa appeared normal  . CORONARY ANGIOPLASTY WITH  STENT PLACEMENT  2007  . ESOPHAGOGASTRODUODENOSCOPY  01/21/2011   RMR: Hiatal hernia. 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 Gastric erosions-status post biopsy (no h.pyori)  . ESOPHAGOGASTRODUODENOSCOPY (EGD) WITH ESOPHAGEAL DILATION N/A 05/02/2013   Procedure: ESOPHAGOGASTRODUODENOSCOPY (EGD) WITH ESOPHAGEAL DILATION;  Surgeon: Daneil Dolin, MD;  [facility];  Service: Endoscopy;  Laterality: N/A;  11:00        Home Medications    Prior to Admission medications   Medication Sig Start Date End Date Taking? Authorizing Provider  aspirin 81 MG tablet Take 81 mg by mouth daily.      [provider]  atorvastatin (LIPITOR) 80 MG tablet Take 80 mg by mouth daily.    [provider]  gabapentin (NEURONTIN) 100 MG capsule Take 100 mg by mouth 3 (three) times daily.  03/21/13   [provider]  glipiZIDE (GLUCOTROL) 5 MG tablet Take 5 mg by mouth daily before breakfast.  03/31/13   [provider]  HYDROcodone-acetaminophen (NORCO) 5-325 MG tablet Take 1 tablet by mouth every 6 (six) hours as needed for moderate pain. 02/24/16   Daryll Brod, MD  metFORMIN (GLUMETZA) 500 MG (MOD) 24 hr tablet Take 500-1,000 mg by mouth 3 (three) times daily. 2 tablets in the morning, 1 tablet at noon, and 1 tablet in the evening.    [provider]  pantoprazole (PROTONIX) 40 MG tablet Take 40 mg by mouth 2 (two) times daily.     [provider]  potassium chloride SA (K-DUR) 20 MEQ tablet Take 20 mEq by mouth daily.    [provider]    Family History Family History  Problem Relation Age of Onset  . Colon cancer Sister        deceased age 82    Social History Social History   Tobacco Use  . Smoking status: Former Smoker    Packs/day: 0.50    Years: 0.30    Pack years: 0.15    Types: Cigarettes    Start date: 06/04/1946    Quit date: 06/03/1949    Years since quitting: 69.2  . Smokeless tobacco: Never Used  .  Tobacco comment: as teenager  Substance Use Topics  . Alcohol use: No  . Drug use: No     Allergies   Penicillins   Review of Systems Review of Systems  Constitutional: Positive for appetite change.  All other systems reviewed and are negative.    Physical Exam Updated Vital Signs BP (!) 163/68   Pulse 68   Temp 97.8 F (36.6 C) (Oral)   Resp 16   Wt 62.6 kg   SpO2 96%   BMI 24.84 kg/m   Physical Exam Vitals signs and nursing note reviewed.  Constitutional:      General: He is not in acute distress.    Appearance: Normal appearance. He is well-developed.  HENT:     Head: Normocephalic and atraumatic.     Right Ear: Hearing normal.     Left Ear: Hearing normal.     Nose: Nose normal.  Eyes:     Conjunctiva/sclera: Conjunctivae normal.     Pupils: Pupils are equal, round, and reactive to light.  Neck:     Musculoskeletal: Normal range of motion and neck supple.  Cardiovascular:     Rate and Rhythm: Regular rhythm.     Heart sounds: S1 normal and S2 normal. No murmur. No friction rub. No gallop.   Pulmonary:     Effort: Pulmonary effort is normal. No respiratory distress.     Breath sounds: Normal breath sounds.  Chest:     Chest wall: No tenderness.  Abdominal:     General: Bowel sounds are normal.     Palpations: Abdomen is soft.     Tenderness: There is no abdominal tenderness. There is no guarding or rebound. Negative signs include Murphy's sign and McBurney's sign.     Hernia: No hernia is present.  Musculoskeletal: Normal range of motion.  Skin:    General: Skin is warm and dry.     Findings: No rash.  Neurological:     Mental Status: He is alert and oriented to person, place, and time.     GCS: GCS eye subscore is 4. GCS verbal subscore is 5. GCS motor subscore is 6.     Cranial Nerves: No cranial nerve deficit.     Sensory: No sensory deficit.     Coordination: Coordination normal.  Psychiatric:        Speech: Speech normal.        Behavior:  Behavior normal.        Thought Content: Thought content normal.      ED Treatments / Results  Labs (all labs ordered are listed, but only abnormal results are displayed) Labs Reviewed  COMPREHENSIVE METABOLIC PANEL - Abnormal; Notable for the following components:      Result Value   Sodium 129 (*)    Potassium <2.0 (*)    Chloride  85 (*)    Glucose, Bld 220 (*)    BUN 32 (*)    Creatinine, Ser 1.32 (*)    Calcium 8.3 (*)    Albumin 3.4 (*)    AST 268 (*)    ALT 246 (*)    Alkaline Phosphatase 133 (*)    Total Bilirubin 2.4 (*)    GFR calc non Af Amer 50 (*)    GFR calc Af Amer 58 (*)    Anion gap 17 (*)    All other components within normal limits  SARS CORONAVIRUS 2  CBC WITH DIFFERENTIAL/PLATELET  URINALYSIS, ROUTINE W REFLEX MICROSCOPIC  MAGNESIUM    EKG EKG Interpretation  Date/Time:  Thursday August 24 2018 01:03:12 EDT Ventricular Rate:  68 PR Interval:    QRS Duration: 111 QT Interval:  549 QTC Calculation: 584 R Axis:   -31 Text Interpretation:  Sinus rhythm Inferior infarct, old Anterior infarct, old Prolonged QT interval No significant change since last tracing Confirmed by Gilda CreasePollina, Reniah Cottingham J [phone]([id number]) on 08/24/2018 1:19:28 AM   Radiology No results found.  Procedures Procedures (including critical care time)  Medications Ordered in ED Medications  potassium chloride SA (K-DUR) CR tablet 40 mEq (has no administration in time range)  potassium chloride 10 mEq in 100 mL IVPB (has no administration in time range)  magnesium sulfate IVPB 2 g 50 mL (has no administration in time range)     Initial Impression / Assessment and Plan / ED Course  I have reviewed the triage vital signs and the nursing notes.  Pertinent labs & imaging results that were available during my care of the patient were reviewed by me and considered in my medical decision making (see chart for details).        Patient presents to the emergency department after family  became concerned of his living conditions.  He lives alone.  His daughter apparently moved away after she got a new job 6 months ago and he has declined since then.  His nephew checked on him tonight and found him living in very poor conditions.  He reports that he has not bathed in approximately 3 weeks.  He has not been eating well, reports poor appetite.  Examination is unremarkable, however.  He is not experiencing any pain, is hard of hearing but otherwise able to answer questions without difficulty.  Patient likely malnourished, has not been eating for at least 2 weeks but probably longer.  Slight elevation of BUN and creatinine consistent with dehydration.  Patient with profound hypokalemia, potassium less than 2.  Will initiate magnesium and potassium replenishment.  LFTs are elevated.  Examination reveals no abdominal tenderness and he has not experienced any pain.  Reviewing his records reveals that he has had elevations in the past.  In June of last year underwent ultrasound to evaluate for elevated LFTs, ultrasound was normal.  Additional imaging can be performed during hospitalization if needed.  Patient will need to be admitted due to his low potassium..  Final Clinical Impressions(s) / ED Diagnoses   Final diagnoses:  Malnutrition, unspecified type [facility](HCC)  Dehydration  Hypokalemia    ED Discharge Orders    None       Gilda CreasePollina, Kimerly Rowand J, MD 08/24/18 [phone]    Gilda CreasePollina, Ifeoluwa Bartz J, MD 08/24/18 [phone]

## 2018-08-24 NOTE — Consult Note (Deleted)
Referring Provider: Cleora FleetJohnson, Clanford L, MD Primary Care Physician:  Gareth MorganKnowlton, Steve, MD Primary Gastroenterologist:  Roetta SessionsMichael Zaidee Rion, MD   Reason for Consultation:  Acute hepatitis, intractable N/V  HPI: Christopher Fields is a 84 y.o. male with h/o DM, CAD, GERD, HTN, sleep apnea not on CPAP who presented to ED via EMS after nephew visited and became concerned about living conditions. Patient reports poor oral intake for several weeks due to poor appetite and vomiting when he tries to eat. He denies abdominal pain. No hematemesis. Decreased BMs with poor oral intake. BM every 2-3 days. No melena, brbpr. No heartburn. No OTC medications except ASA 81 mg daily.   In ED, CBC normal. Sodium 129, Potassium <2.0, BUN 32, Creatinine 1.32, glucose 220, T bili 2.4, AP 133, AST 268, ALT 246, alb 3.4  RUQ U/S last year for abnormal LFTs was negative. LFTs 02/2016 were normal. No interim LFTs in Epic.  Patient has history of esophageal dysphagia due to esophageal web in the past requiring esophageal dilation in 2015 and 2013. Last EGD 04/2013, probable cervical esophageal web, gastritis. No h.pylori. Last colonoscopy 2008 was normal. Recommended five year follow up due to FH of CRC but never done.    Prior to Admission medications   Medication Sig Start Date End Date Taking? Authorizing Provider  aspirin 81 MG tablet Take 81 mg by mouth daily.      [provider]  atorvastatin (LIPITOR) 80 MG tablet Take 80 mg by mouth daily.    [provider]  gabapentin (NEURONTIN) 100 MG capsule Take 100 mg by mouth 3 (three) times daily.  03/21/13   [provider]  glipiZIDE (GLUCOTROL) 5 MG tablet Take 5 mg by mouth daily before breakfast.  03/31/13   [provider]  HYDROcodone-acetaminophen (NORCO) 5-325 MG tablet Take 1 tablet by mouth every 6 (six) hours as needed for moderate pain. 02/24/16   Cindee SaltKuzma, Gary, MD  metFORMIN (GLUMETZA) 500 MG (MOD) 24 hr tablet Take 500-1,000 mg by  mouth 3 (three) times daily. 2 tablets in the morning, 1 tablet at noon, and 1 tablet in the evening.    [provider]  pantoprazole (PROTONIX) 40 MG tablet Take 40 mg by mouth 2 (two) times daily.     [provider]  potassium chloride SA (K-DUR) 20 MEQ tablet Take 20 mEq by mouth daily.    [provider]    Current Facility-Administered Medications  Medication Dose Route Frequency Provider Last Rate Last Dose  . 0.9 % NaCl with KCl 40 mEq / L  infusion   Intravenous Continuous Bobette Mortiz, David Manuel, MD 100 mL/hr at 08/24/18 0301 100 mL/hr at 08/24/18 0301  . acetaminophen (TYLENOL) tablet 650 mg  650 mg Oral Q6H PRN Bobette Mortiz, David Manuel, MD       Or  . acetaminophen (TYLENOL) suppository 650 mg  650 mg Rectal Q6H PRN Bobette Mortiz, David Manuel, MD      . enoxaparin (LOVENOX) injection 40 mg  40 mg Subcutaneous Q24H Bobette Mortiz, David Manuel, MD       Current Outpatient Medications  Medication Sig Dispense Refill  . aspirin 81 MG tablet Take 81 mg by mouth daily.      Marland Kitchen. atorvastatin (LIPITOR) 80 MG tablet Take 80 mg by mouth daily.    Marland Kitchen. gabapentin (NEURONTIN) 100 MG capsule Take 100 mg by mouth 3 (three) times daily.     Marland Kitchen. glipiZIDE (GLUCOTROL) 5 MG tablet Take 5 mg by mouth daily before  breakfast.     . HYDROcodone-acetaminophen (NORCO) 5-325 MG tablet Take 1 tablet by mouth every 6 (six) hours as needed for moderate pain. 20 tablet 0  . metFORMIN (GLUMETZA) 500 MG (MOD) 24 hr tablet Take 500-1,000 mg by mouth 3 (three) times daily. 2 tablets in the morning, 1 tablet at noon, and 1 tablet in the evening.    . pantoprazole (PROTONIX) 40 MG tablet Take 40 mg by mouth 2 (two) times daily.     . potassium chloride SA (K-DUR) 20 MEQ tablet Take 20 mEq by mouth daily.      Allergies as of 08/24/2018 - Review Complete 08/24/2018  Allergen Reaction Noted  . Penicillins Itching and Rash 12/31/2010    Past Medical History:  Diagnosis Date  . CAD (coronary artery disease)   .  Diabetes mellitus   . GERD (gastroesophageal reflux disease)   . Hypertension   . Sleep apnea    Has never used his machine    Past Surgical History:  Procedure Laterality Date  . CARPAL TUNNEL RELEASE Right 02/24/2016   Procedure: RIGHT CARPAL TUNNEL RELEASE;  Surgeon: Cindee SaltKuzma, Gary, MD;  Location: [facility];  Service: Orthopedics;  Laterality: Right;  . COLONOSCOPY  07/01/06   normal colon colonic mucosa appeared normal  . CORONARY ANGIOPLASTY WITH STENT PLACEMENT  2007  . ESOPHAGOGASTRODUODENOSCOPY  01/21/2011   RMR: Hiatal hernia. 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 Gastric erosions-status post biopsy (no h.pyori)  . ESOPHAGOGASTRODUODENOSCOPY (EGD) WITH ESOPHAGEAL DILATION N/A 05/02/2013   Dr. Jena Gaussourk: gastritis, probable cervical esophageal web    Family History  Problem Relation Age of Onset  . Colon cancer Sister        deceased age 82    Social History   Socioeconomic History  . Marital status: Widowed    Spouse name: Not on file  . Number of children: Not on file  . Years of education: Not on file  . Highest education level: Not on file  Occupational History  . Not on file  Social Needs  . Financial resource strain: Not on file  . Food insecurity    Worry: Not on file    Inability: Not on file  . Transportation needs    Medical: Not on file    Non-medical: Not on file  Tobacco Use  . Smoking status: Former Smoker    Packs/day: 0.50    Years: 0.30    Pack years: 0.15    Types: Cigarettes    Start date: 06/04/1946    Quit date: 06/03/1949    Years since quitting: 69.2  . Smokeless tobacco: Never Used  . Tobacco comment: as teenager  Substance and Sexual Activity  . Alcohol use: No  . Drug use: No  . Sexual activity: Not on file  Lifestyle  . Physical activity    Days per week: Not on file    Minutes per session: Not on file  . Stress: Not on file  Relationships  . Social Musicianconnections    Talks on phone: Not on file     Gets together: Not on file    Attends religious service: Not on file    Active member of club or organization: Not on file    Attends meetings of clubs or organizations: Not on file    Relationship status: Not on file  . Intimate partner violence    Fear of current or ex partner: Not on file    Emotionally abused: Not on file  Physically abused: Not on file    Forced sexual activity: Not on file  Other Topics Concern  . Not on file  Social History Narrative  . Not on file     ROS:  General: Negative for   fever, chills, fatigue, weakness. See hpi Eyes: Negative for vision changes.  ENT: Negative for hoarseness, difficulty swallowing , nasal congestion. Hard of hearing CV: Negative for chest pain, angina, palpitations, dyspnea on exertion, peripheral edema.  Respiratory: Negative for dyspnea at rest, dyspnea on exertion, cough, sputum, wheezing.  GI: See history of present illness. GU:  Negative for dysuria, hematuria, urinary incontinence, urinary frequency, nocturnal urination.  MS: Negative for joint pain, low back pain.  Derm: Negative for rash or itching.  Neuro: Negative for weakness, abnormal sensation, seizure, frequent headaches, memory loss, confusion.  Psych: Negative for anxiety, depression, suicidal ideation, hallucinations.  Endo: 18 pound weight loss since 01/2017. Heme: Negative for bruising or bleeding. Allergy: Negative for rash or hives.       Physical Examination: Vital signs in last 24 hours: Temp:  [97.8 F (36.6 C)] 97.8 F (36.6 C) (08/06 0032) Pulse Rate:  [53-72] 53 (08/06 0730) Resp:  [12-18] 15 (08/06 0730) BP: (94-163)/(45-74) 94/45 (08/06 0730) SpO2:  [94 %-99 %] 97 % (08/06 0730) Weight:  [62.6 kg] 62.6 kg (08/06 0040)    General: elderly male, very hard of hearing, in NAD.  Head: Normocephalic, atraumatic.   Eyes: Conjunctiva pink, no icterus.  Neck: Supple without thyromegaly, masses, or lymphadenopathy.  Lungs: Clear to auscultation  bilaterally.  Heart: Regular rate and rhythm, no murmurs rubs or gallops.  Abdomen: Bowel sounds are normal, nontender, nondistended, no hepatosplenomegaly or masses, no abdominal bruits or    hernia , no rebound or guarding.   Rectal: not performed Extremities: No lower extremity edema, clubbing, deformity.  Neuro: Alert and oriented x 4 , grossly normal neurologically.  Skin: Warm and dry, no rash or jaundice.   Psych: Alert and cooperative, normal mood and affect.        Intake/Output from previous day: No intake/output data recorded. Intake/Output this shift: No intake/output data recorded.  Lab Results: CBC Recent Labs    08/24/18 0052  WBC 8.9  HGB 14.4  HCT 40.3  MCV 81.9  PLT 232   BMET Recent Labs    08/24/18 0052  NA 129*  K <2.0*  CL 85*  CO2 27  GLUCOSE 220*  BUN 32*  CREATININE 1.32*  CALCIUM 8.3*   LFT Recent Labs    08/24/18 0052  BILITOT 2.4*  ALKPHOS 133*  AST 268*  ALT 246*  PROT 7.7  ALBUMIN 3.4*    Lipase No results for input(s): LIPASE in the last 72 hours.  PT/INR No results for input(s): LABPROT, INR in the last 72 hours.    Imaging Studies: No results found.Pierre.Alas[4 week]   Impression: Very pleasant 84 y/o male with DM, CAD, HTN, GERD presented to ED via EMS due to concerns about living conditions as well as postprandial vomiting for 3-4 weeks. Patient with documented 18 pound weight loss since 01/2017. Reported abnormal LFTs last year and RUQ U/S unremarkable. Those labs are unavailable for comparison. Noted dehydration and profound electrolyte abnormalities.   Given prior history of abnormal LFTs it is unclear if recent symptomatology is related. Recommend updated imaging via U/S. Cannot exclude need for upper endoscopy to evaluate postprandial N/V will tentatively schedule for tomorrow.    Plan: 1. Start PPI.  2. LFTs  in AM. 3. Abdominal U/S today.  4. Clear liquids after U/S. NPO after midnight.  5. Tentatively plan for EGD  tomorrow if U/S unrevealing.  I have discussed the risks, alternatives, benefits with regards to but not limited to the risk of reaction to medication, bleeding, infection, perforation and the patient is agreeable to proceed. Written consent to be obtained.  We would like to thank you for the opportunity to participate in the care of Christopher Fields.     LOS: 0 days   Attending note: Agree with above assessment and recommendations.  Ultrasound is come back negative.  Plan for diagnostic EGD tomorrow with repeat LFTs.  Further recommendations to follow.

## 2018-08-24 NOTE — ED Notes (Signed)
US at bedside

## 2018-08-24 NOTE — ED Notes (Signed)
[facility] called and states they have accepted case and will be by to see pt.

## 2018-08-24 NOTE — ED Notes (Signed)
Date and time results received: 08/24/18 08/24/18 (use smartphrase ".now" to insert current time)  Test: potassium Critical Value: <2.0  Name of Provider Notified: Dr Betsey Holiday  Orders Received? Or Actions Taken?: Actions Taken: no orders received

## 2018-08-24 NOTE — ED Notes (Signed)
ED TO INPATIENT HANDOFF REPORT  ED Nurse Name and Phone #: [phone]  S Name/Age/Gender Christopher Fields 84 y.o. male Room/Bed: APA16A/APA16A  Code Status   Code Status: Full Code  Home/SNF/Other Home Patient oriented to: self Is this baseline? Yes   Triage Complete: Triage complete  Chief Complaint Wellness Check  Triage Note Pt brought in by EMS after being called by nephew for concerns related to pt living conditions, lethargy, and decreased appetite. Pt says he has had decreased appetite for about 2-3 weeks. Pt says he "throws up when he eats."  Pt alert and oriented to self, place, and situation.    Allergies Allergies  Allergen Reactions  . Penicillins Itching and Rash    Level of Care/Admitting Diagnosis ED Disposition    ED Disposition Condition Comment   Admit  [facility] [[id number]]  Level of Care: Telemetry [5]  Covid Evaluation: Asymptomatic Screening Protocol (No Symptoms)  Diagnosis: Hypokalemia [191478][[id number]]  Admitting Physician: Cleora FleetJOHNSON, CLANFORD L [[id number]]  Attending Physician: Cleora FleetJOHNSON, CLANFORD L [[id number]]  Estimated length of stay: past midnight tomorrow  Certification:: I certify this patient will need inpatient services for at least 2 midnights  PT Class (Do Not Modify): Inpatient [101]  PT Acc Code (Do Not Modify): Private [1]       B Medical/Surgery History Past Medical History:  Diagnosis Date  . CAD (coronary artery disease)   . Diabetes mellitus   . GERD (gastroesophageal reflux disease)   . Hypertension   . Sleep apnea    Has never used his machine   Past Surgical History:  Procedure Laterality Date  . CARPAL TUNNEL RELEASE Right 02/24/2016   Procedure: RIGHT CARPAL TUNNEL RELEASE;  Surgeon: Cindee SaltKuzma, Gary, MD;  Location: [facility];  Service: Orthopedics;  Laterality: Right;  . COLONOSCOPY  07/01/06   normal colon colonic mucosa appeared normal  . CORONARY ANGIOPLASTY WITH STENT PLACEMENT  2007  .  ESOPHAGOGASTRODUODENOSCOPY  01/21/2011   RMR: Hiatal hernia. 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 Gastric erosions-status post biopsy (no h.pyori)  . ESOPHAGOGASTRODUODENOSCOPY (EGD) WITH ESOPHAGEAL DILATION N/A 05/02/2013   Dr. Jena Gaussourk: gastritis, probable cervical esophageal web     A IV Location/Drains/Wounds Patient Lines/Drains/Airways Status   Active Line/Drains/Airways    Name:   Placement date:   Placement time:   Site:   Days:   Peripheral IV 08/24/18 Left Antecubital   08/24/18    0056    Antecubital   less than 1   Peripheral IV 08/24/18 Left Forearm   08/24/18    0236    Forearm   less than 1   External Urinary Catheter   08/24/18    0457    -   less than 1   Incision (Closed) 02/24/16 Wrist Right   02/24/16    0857     912          Intake/Output Last 24 hours  Intake/Output Summary (Last 24 hours) at 08/24/2018 1713 Last data filed at 08/24/2018 1407 Gross per 24 hour  Intake 1110 ml  Output -  Net 1110 ml    Labs/Imaging Results for orders placed or performed during the hospital encounter of 08/24/18 (from the past 48 hour(s))  Urinalysis, Routine w reflex microscopic     Status: Abnormal   Collection Time: 08/24/18 12:42 AM  Result Value Ref Range   Color, Urine YELLOW YELLOW   APPearance CLEAR CLEAR   Specific Gravity, Urine 1.013 1.005 - 1.030  pH 6.0 5.0 - 8.0   Glucose, UA 50 (A) NEGATIVE mg/dL   Hgb urine dipstick MODERATE (A) NEGATIVE   Bilirubin Urine NEGATIVE NEGATIVE   Ketones, ur 20 (A) NEGATIVE mg/dL   Protein, ur 161100 (A) NEGATIVE mg/dL   Nitrite NEGATIVE NEGATIVE   Leukocytes,Ua NEGATIVE NEGATIVE   RBC / HPF 0-5 0 - 5 RBC/hpf   WBC, UA 0-5 0 - 5 WBC/hpf   Bacteria, UA NONE SEEN NONE SEEN   Hyaline Casts, UA PRESENT     Comment: Performed at [facility], [address] [place], [place] [address]  CBC with Differential/Platelet     Status: None   Collection Time: 08/24/18 12:52 AM  Result Value Ref Range   WBC 8.9 4.0 - 10.5  K/uL   RBC 4.92 4.22 - 5.81 MIL/uL   Hemoglobin 14.4 13.0 - 17.0 g/dL   HCT 54.040.3 98.139.0 - 19.152.0 %   MCV 81.9 80.0 - 100.0 fL   MCH 29.3 26.0 - 34.0 pg   MCHC 35.7 30.0 - 36.0 g/dL   RDW 47.812.7 29.511.5 - 62.115.5 %   Platelets 232 150 - 400 K/uL   nRBC 0.0 0.0 - 0.2 %   Neutrophils Relative % 75 %   Neutro Abs 6.6 1.7 - 7.7 K/uL   Lymphocytes Relative 14 %   Lymphs Abs 1.2 0.7 - 4.0 K/uL   Monocytes Relative 10 %   Monocytes Absolute 0.9 0.1 - 1.0 K/uL   Eosinophils Relative 0 %   Eosinophils Absolute 0.0 0.0 - 0.5 K/uL   Basophils Relative 1 %   Basophils Absolute 0.0 0.0 - 0.1 K/uL   Immature Granulocytes 0 %   Abs Immature Granulocytes 0.03 0.00 - 0.07 K/uL    Comment: Performed at [facility], [address] [place], [place] [address]  Comprehensive metabolic panel     Status: Abnormal   Collection Time: 08/24/18 12:52 AM  Result Value Ref Range   Sodium 129 (L) 135 - 145 mmol/L   Potassium <2.0 (LL) 3.5 - 5.1 mmol/L    Comment: REPEATED TO VERIFY CRITICAL RESULT CALLED TO, READ BACK BY AND VERIFIED WITH: K BELTON,RN @0157  08/24/18 MKELLY    Chloride 85 (L) 98 - 111 mmol/L   CO2 27 22 - 32 mmol/L   Glucose, Bld 220 (H) 70 - 99 mg/dL   BUN 32 (H) 8 - 23 mg/dL   Creatinine, Ser 7.841.32 (H) 0.61 - 1.24 mg/dL   Calcium 8.3 (L) 8.9 - 10.3 mg/dL   Total Protein 7.7 6.5 - 8.1 g/dL   Albumin 3.4 (L) 3.5 - 5.0 g/dL   AST 696268 (H) 15 - 41 U/L   ALT 246 (H) 0 - 44 U/L   Alkaline Phosphatase 133 (H) 38 - 126 U/L   Total Bilirubin 2.4 (H) 0.3 - 1.2 mg/dL   GFR calc non Af Amer 50 (L) >60 mL/min   GFR calc Af Amer 58 (L) >60 mL/min   Anion gap 17 (H) 5 - 15    Comment: Performed at [facility], [address] [place], [place] [address]  Magnesium     Status: Abnormal   Collection Time: 08/24/18 12:52 AM  Result Value Ref Range   Magnesium 2.6 (H) 1.7 - 2.4 mg/dL    Comment: Performed at [facility], [address] [place], [place] [address]  Hemoglobin A1c     Status: Abnormal    Collection Time: 08/24/18 12:52 AM  Result Value Ref Range   Hgb  A1c MFr Bld 7.3 (H) 4.8 - 5.6 %    Comment: (NOTE) Pre diabetes:          5.7%-6.4% Diabetes:              >6.4% Glycemic control for   <7.0% adults with diabetes    Mean Plasma Glucose 162.81 mg/dL    Comment: Performed at [facility], [place] [address] [place], [place] [address]  Comprehensive metabolic panel     Status: Abnormal   Collection Time: 08/24/18  7:49 AM  Result Value Ref Range   Sodium 130 (L) 135 - 145 mmol/L   Potassium 2.9 (L) 3.5 - 5.1 mmol/L    Comment: DELTA CHECK NOTED   Chloride 89 (L) 98 - 111 mmol/L   CO2 26 22 - 32 mmol/L   Glucose, Bld 190 (H) 70 - 99 mg/dL   BUN 30 (H) 8 - 23 mg/dL   Creatinine, Ser 1.11 0.61 - 1.24 mg/dL   Calcium 8.0 (L) 8.9 - 10.3 mg/dL   Total Protein 6.9 6.5 - 8.1 g/dL   Albumin 3.2 (L) 3.5 - 5.0 g/dL   AST 237 (H) 15 - 41 U/L   ALT 221 (H) 0 - 44 U/L   Alkaline Phosphatase 118 38 - 126 U/L   Total Bilirubin 2.0 (H) 0.3 - 1.2 mg/dL   GFR calc non Af Amer >60 >60 mL/min   GFR calc Af Amer >60 >60 mL/min   Anion gap 15 5 - 15    Comment: Performed at [facility], [address] [place], [place] [address]  Magnesium     Status: Abnormal   Collection Time: 08/24/18  7:49 AM  Result Value Ref Range   Magnesium 3.0 (H) 1.7 - 2.4 mg/dL    Comment: Performed at [facility], [address] [place],  [address]  CBG monitoring, ED     Status: Abnormal   Collection Time: 08/24/18  9:21 AM  Result Value Ref Range   Glucose-Capillary 174 (H) 70 - 99 mg/dL   US Abdomen Complete  Result Date: 08/24/2018 CLINICAL DATA:  Elevated liver function tests.  Vomiting. EXAM: ABDOMEN ULTRASOUND COMPLETE COMPARISON:  None. FINDINGS: Gallbladder: There is slight sludge in the gallbladder. Gallbladder wall thickness is 3 mm, normal. No stones. Common bile duct: Diameter: 3 mm, normal. Liver: No focal lesion identified. Within normal limits in parenchymal echogenicity. Portal  vein is patent on color Doppler imaging with normal direction of blood flow towards the liver. IVC: Not visualized.  Obscured by bowel gas. Pancreas: Visualized portion unremarkable. Spleen: Size and appearance within normal limits. Right Kidney: Length: 8.9 cm. Echogenicity within normal limits. No mass or hydronephrosis visualized. Left Kidney: Length: 10.7 cm. Echogenicity within normal limits. No mass or hydronephrosis visualized. Abdominal aorta: No aneurysm visualized. Other findings: None. IMPRESSION: No significant abnormalities. Electronically Signed   By: Lorriane Shire M.D.   On: 08/24/2018 13:04    Pending Labs Unresulted Labs (From admission, onward)    Start     Ordered   08/25/18 0500  CBC  Tomorrow morning,   R     08/24/18 0259   08/25/18 0500  Comprehensive metabolic panel  Daily,   R     08/24/18 0826   08/25/18 0500  Magnesium  Daily,   R     08/24/18 0827   08/24/18 0743  Hepatitis panel, acute  Add-on,   AD     08/24/18 7628   08/24/18 3151  SARS CORONAVIRUS 2 Nasal Swab Aptima Multi Swab  (Asymptomatic/Tier 2 Patients Labs)  Once,   STAT    Question Answer Comment  Is this test for diagnosis or screening Screening   Symptomatic for COVID-19 as defined by CDC No   Hospitalized for COVID-19 No   Admitted to ICU for COVID-19 No   Previously tested for COVID-19 No   Resident in a congregate (group) care setting No   Employed in healthcare setting No      08/24/18 0218          Vitals/Pain Today's Vitals   08/24/18 1500 08/24/18 1530 08/24/18 1600 08/24/18 1630  BP: 109/61 133/63 116/65 (!) 107/59  Pulse: (!) 50 (!) 53 (!) 58 (!) 50  Resp: 13 13 13 16   Temp:      TempSrc:      SpO2: 96% 98% 98% 96%  Weight:      PainSc:        Isolation Precautions No active isolations  Medications Medications  0.9 % NaCl with KCl 40 mEq / L  infusion (100 mL/hr Intravenous New Bag/Given 08/24/18 1438)  acetaminophen (TYLENOL) tablet 650 mg (has no administration in  time range)    Or  acetaminophen (TYLENOL) suppository 650 mg (has no administration in time range)  pantoprazole (PROTONIX) injection 40 mg (40 mg Intravenous Given 08/24/18 1413)  potassium chloride SA (K-DUR) CR tablet 40 mEq (40 mEq Oral Given 08/24/18 0241)  potassium chloride 10 mEq in 100 mL IVPB (0 mEq Intravenous Stopped 08/24/18 0536)  magnesium sulfate IVPB 2 g 50 mL (0 g Intravenous Stopped 08/24/18 0337)    Mobility  Moderate fall risk   Focused Assessments    R Recommendations: See Admitting Provider Note  Report given to:   Additional Notes:

## 2018-08-24 NOTE — ED Notes (Signed)
Pt given breakfast tray

## 2018-08-24 NOTE — H&P (Signed)
History and Physical    Christopher Fields ZOX:[id number] DOB: 02-17-1934 DOA: 08/24/2018  PCP: Gareth MorganKnowlton, Steve, MD   Patient coming from: Home.  I have personally briefly reviewed patient's [facility]  Chief Complaint: Decreased appetite and vomiting.  HPI: Christopher SleeperLarry C Fields is a 84 y.o. male with medical history significant of CAD, type 2 diabetes, GERD, hypertension, sleep apnea not on CPAP who is coming to the emergency department due to decreased appetite and frequent vomiting after meals.  He denies abdominal pain, diarrhea, constipation, melena or hematochezia.  No dysuria, frequency or hematuria.  Headache, fever, chills, sore throat, rhinorrhea, productive cough, dyspnea, chest pain, palpitations, diaphoresis, PND or orthopnea.  Denies polyuria, polydipsia, polyphagia or blurred vision.  ED Course: Initial vital signs temperature 97.8 F, pulse 72, respiration 18, blood pressure 147/65 mmHg and O2 sat 94% on room air.  The patient received potassium and magnesium supplementation in the emergency department.  His CBC was normal with a white count of 8.9, hemoglobin 14.4 g/dL and platelets 811232.  CMP shows sodium of 129, potassium less than 2.0, chloride 85 and CO2 27 mmol/L.  BUN was 32, creatinine 1.32, glucose 220 and calcium 8.3 mg/dL.  Total protein 7.7 and albumin 3.4 g/dL.  AST 268, ALT 246 and alkaline phosphatase 133 units/L.  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  Review of Systems: As per HPI otherwise 10 point review of systems negative.   Past Medical History:  Diagnosis Date  . CAD (coronary artery disease)   . Diabetes mellitus   . GERD (gastroesophageal reflux disease)   . Hypertension   . Sleep apnea    Has never used his machine    Past Surgical History:  Procedure Laterality Date  . CARPAL TUNNEL RELEASE Right 02/24/2016   Procedure: RIGHT CARPAL TUNNEL RELEASE;   Surgeon: Cindee SaltKuzma, Gary, MD;  Location: [facility];  Service: Orthopedics;  Laterality: Right;  . COLONOSCOPY  07/01/06   normal colon colonic mucosa appeared normal  . CORONARY ANGIOPLASTY WITH STENT PLACEMENT  2007  . ESOPHAGOGASTRODUODENOSCOPY  01/21/2011   RMR: Hiatal hernia. 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 Gastric erosions-status post biopsy (no h.pyori)  . ESOPHAGOGASTRODUODENOSCOPY (EGD) WITH ESOPHAGEAL DILATION N/A 05/02/2013   Procedure: ESOPHAGOGASTRODUODENOSCOPY (EGD) WITH ESOPHAGEAL DILATION;  Surgeon: Corbin Adeobert M Rourk, MD;  [facility];  Service: Endoscopy;  Laterality: N/A;  11:00     reports that he quit smoking about 69 years ago. His smoking use included cigarettes. He started smoking about 72 years ago. He has a 0.15 pack-year smoking history. He has never used smokeless tobacco. He reports that he does not drink alcohol or use drugs.  Allergies  Allergen Reactions  . Penicillins Itching and Rash    Family History  Problem Relation Age of Onset  . Colon cancer Sister        deceased age 82   Prior to Admission medications   Medication Sig Start Date End Date Taking? Authorizing Provider  aspirin 81 MG tablet Take 81 mg by mouth daily.      [provider]  atorvastatin (LIPITOR) 80 MG tablet Take 80 mg by mouth daily.    [provider]  gabapentin (NEURONTIN) 100 MG capsule Take 100 mg by mouth 3 (three) times daily.  03/21/13   [provider]  glipiZIDE (GLUCOTROL) 5 MG tablet Take 5 mg by mouth daily before  breakfast.  03/31/13   [provider]  HYDROcodone-acetaminophen (NORCO) 5-325 MG tablet Take 1 tablet by mouth every 6 (six) hours as needed for moderate pain. 02/24/16   Cindee SaltKuzma, Gary, MD  metFORMIN (GLUMETZA) 500 MG (MOD) 24 hr tablet Take 500-1,000 mg by mouth 3 (three) times daily. 2 tablets in the morning, 1 tablet at noon, and 1 tablet in the evening.    [provider]   pantoprazole (PROTONIX) 40 MG tablet Take 40 mg by mouth 2 (two) times daily.     [provider]  potassium chloride SA (K-DUR) 20 MEQ tablet Take 20 mEq by mouth daily.    [provider]    Physical Exam: Vitals:   08/24/18 0040 08/24/18 0130 08/24/18 0200 08/24/18 0230  BP:  (!) 163/68 123/74 131/66  Pulse:  68 67 64  Resp:  16 12 14   Temp:      TempSrc:      SpO2:  96% 98% 98%  Weight: 62.6 kg       Constitutional: NAD, calm, comfortable Eyes: PERRL, lids and conjunctivae normal ENMT: Mucous membranes are moist. Posterior pharynx clear of any exudate or lesions. Neck: normal, supple, no masses, no thyromegaly Respiratory: clear to auscultation bilaterally, no wheezing, no crackles. Normal respiratory effort. No accessory muscle use.  Cardiovascular: Regular rate and rhythm, no murmurs / rubs / gallops. No extremity edema. 2+ pedal pulses. No carotid bruits.  Abdomen: Soft, no tenderness, no masses palpated. No hepatosplenomegaly. Bowel sounds positive.  Musculoskeletal: no clubbing / cyanosis.  Good ROM, no contractures. Normal muscle tone.  Skin: no rashes, lesions, ulcers on limited dermatological examination. Neurologic: CN 2-12 grossly intact. Sensation intact, DTR normal. Strength 5/5 in all 4.  Psychiatric:  Alert and oriented x 3. Normal mood.   Labs on Admission: I have personally reviewed following labs and imaging studies  CBC: Recent Labs  Lab 08/24/18 0052  WBC 8.9  NEUTROABS 6.6  HGB 14.4  HCT 40.3  MCV 81.9  PLT 232   Basic Metabolic Panel: Recent Labs  Lab 08/24/18 0052  NA 129*  K <2.0*  CL 85*  CO2 27  GLUCOSE 220*  BUN 32*  CREATININE 1.32*  CALCIUM 8.3*  MG 2.6*   GFR: CrCl cannot be calculated (Unknown ideal weight.). Liver Function Tests: Recent Labs  Lab 08/24/18 0052  AST 268*  ALT 246*  ALKPHOS 133*  BILITOT 2.4*  PROT 7.7  ALBUMIN 3.4*   No results for input(s): LIPASE, AMYLASE in the last 168 hours.  No results for input(s): AMMONIA in the last 168 hours. Coagulation Profile: No results for input(s): INR, PROTIME in the last 168 hours. Cardiac Enzymes: No results for input(s): CKTOTAL, CKMB, CKMBINDEX, TROPONINI in the last 168 hours. BNP (last 3 results) No results for input(s): PROBNP in the last 8760 hours. HbA1C: No results for input(s): HGBA1C in the last 72 hours. CBG: No results for input(s): GLUCAP in the last 168 hours. Lipid Profile: No results for input(s): CHOL, HDL, LDLCALC, TRIG, CHOLHDL, LDLDIRECT in the last 72 hours. Thyroid Function Tests: No results for input(s): TSH, T4TOTAL, FREET4, T3FREE, THYROIDAB in the last 72 hours. Anemia Panel: No results for input(s): VITAMINB12, FOLATE, FERRITIN, TIBC, IRON, RETICCTPCT in the last 72 hours. Urine analysis:    Component Value Date/Time   COLORURINE YELLOW 04/21/2015 1603   APPEARANCEUR CLEAR 04/21/2015 1603   LABSPEC 1.010 04/21/2015 1603   PHURINE 6.5 04/21/2015 1603   GLUCOSEU >1000 (A) 04/21/2015 1603  HGBUR NEGATIVE 04/21/2015 1603   BILIRUBINUR NEGATIVE 04/21/2015 1603   KETONESUR NEGATIVE 04/21/2015 1603   PROTEINUR NEGATIVE 04/21/2015 1603   NITRITE NEGATIVE 04/21/2015 1603   LEUKOCYTESUR NEGATIVE 04/21/2015 1603    Radiological Exams on Admission: No results found.  EKG: Independently reviewed.  Vent. rate 68 BPM PR interval * ms QRS duration 111 ms QT/QTc 549/584 ms P-R-T axes 53 -31 53 Sinus rhythm Inferior infarct, old Anterior infarct, old Prolonged QT interval No significant change since previous.  Assessment/Plan Principal Problem:   Hypokalemia Observation/telemetry. Continue potassium replacement. Magnesium sulfate was given by Dr. Betsey Holiday. Follow-up potassium level. Follow-up EKG which is showing QT prolongation. Avoid QT prolonging medications.  Active Problems:   Hyponatremia Some degree of pseudohyponatremia, But some likely due to GI losses. Continue NS infusion.  Follow-up sodium level.    Type 2 diabetes mellitus (HCC) Resume glipizide once dose confirmed. Resume metformin after med reconciliation. Carbohydrate modified diet. CBG monitoring with regular insulin sliding scale.    Hyperlipidemia Hold statin due to abnormal LFTs.    Abnormal LFTs Had had a normal RUQ [place] last year in June. Repeat LFTs in a.m. Repeat RUQ ultrasound.    Essential hypertension Not sure if on medical therapy. Will need med reconciliation. Monitor blood pressure.    Coronary atherosclerosis Resume aspirin,  once meds reconciliated    GERD Protonix 40 mg p.o. daily.     DVT prophylaxis: Lovenox SQ. Code Status: Full code. Family Communication: Disposition Plan: Admit for IV hydration and electrolyte replacement. Consults called: Admission status: Observation/telemetry.  Reubin Milan MD [facility]  If 7PM-7AM, please contact night-coverage [email or web address]  08/24/2018, 3:21 AM   This document was prepared using Dragon voice recognition software and may contain some unintended transcription errors.

## 2018-08-24 NOTE — ED Notes (Signed)
APS at bedside 

## 2018-08-24 NOTE — Progress Notes (Addendum)
Tried to call patient's daughter to update on plan for upper endoscopy tomorrow. Left message for return call.   Will hold Lovenox planned for 08/25/18.  Laureen Ochs. Bernarda Caffey [facility] [phone] 8/6/20203:36 PM

## 2018-08-24 NOTE — ED Notes (Signed)
Elta [place] [phone]

## 2018-08-24 NOTE — ED Notes (Signed)
[facility]ult [facility] called about pt's living condition. Spoke with Christopher Fields from [place].

## 2018-08-24 NOTE — Progress Notes (Signed)
COVERAGE NOTE   08/24/2018 11:38 AM  Christopher Fields was seen and examined.  The H&P by the admitting provider, orders, imaging was reviewed.  Please see new orders.  Will continue to follow.   Vitals:   08/24/18 1032 08/24/18 1100  BP: (!) 124/55 (!) 108/54  Pulse: (!) 58 (!) 59  Resp: 12 17  Temp:    SpO2: 98% 96%    Results for orders placed or performed during the hospital encounter of 08/24/18  CBC with Differential/Platelet  Result Value Ref Range   WBC 8.9 4.0 - 10.5 K/uL   RBC 4.92 4.22 - 5.81 MIL/uL   Hemoglobin 14.4 13.0 - 17.0 g/dL   HCT 16.140.3 09.639.0 - 04.552.0 %   MCV 81.9 80.0 - 100.0 fL   MCH 29.3 26.0 - 34.0 pg   MCHC 35.7 30.0 - 36.0 g/dL   RDW 40.912.7 81.111.5 - 91.415.5 %   Platelets 232 150 - 400 K/uL   nRBC 0.0 0.0 - 0.2 %   Neutrophils Relative % 75 %   Neutro Abs 6.6 1.7 - 7.7 K/uL   Lymphocytes Relative 14 %   Lymphs Abs 1.2 0.7 - 4.0 K/uL   Monocytes Relative 10 %   Monocytes Absolute 0.9 0.1 - 1.0 K/uL   Eosinophils Relative 0 %   Eosinophils Absolute 0.0 0.0 - 0.5 K/uL   Basophils Relative 1 %   Basophils Absolute 0.0 0.0 - 0.1 K/uL   Immature Granulocytes 0 %   Abs Immature Granulocytes 0.03 0.00 - 0.07 K/uL  Comprehensive metabolic panel  Result Value Ref Range   Sodium 129 (L) 135 - 145 mmol/L   Potassium <2.0 (LL) 3.5 - 5.1 mmol/L   Chloride 85 (L) 98 - 111 mmol/L   CO2 27 22 - 32 mmol/L   Glucose, Bld 220 (H) 70 - 99 mg/dL   BUN 32 (H) 8 - 23 mg/dL   Creatinine, Ser 7.821.32 (H) 0.61 - 1.24 mg/dL   Calcium 8.3 (L) 8.9 - 10.3 mg/dL   Total Protein 7.7 6.5 - 8.1 g/dL   Albumin 3.4 (L) 3.5 - 5.0 g/dL   AST 956268 (H) 15 - 41 U/L   ALT 246 (H) 0 - 44 U/L   Alkaline Phosphatase 133 (H) 38 - 126 U/L   Total Bilirubin 2.4 (H) 0.3 - 1.2 mg/dL   GFR calc non Af Amer 50 (L) >60 mL/min   GFR calc Af Amer 58 (L) >60 mL/min   Anion gap 17 (H) 5 - 15  Urinalysis, Routine w reflex microscopic  Result Value Ref Range   Color, Urine YELLOW YELLOW   APPearance CLEAR  CLEAR   Specific Gravity, Urine 1.013 1.005 - 1.030   pH 6.0 5.0 - 8.0   Glucose, UA 50 (A) NEGATIVE mg/dL   Hgb urine dipstick MODERATE (A) NEGATIVE   Bilirubin Urine NEGATIVE NEGATIVE   Ketones, ur 20 (A) NEGATIVE mg/dL   Protein, ur 213100 (A) NEGATIVE mg/dL   Nitrite NEGATIVE NEGATIVE   Leukocytes,Ua NEGATIVE NEGATIVE   RBC / HPF 0-5 0 - 5 RBC/hpf   WBC, UA 0-5 0 - 5 WBC/hpf   Bacteria, UA NONE SEEN NONE SEEN   Hyaline Casts, UA PRESENT   Magnesium  Result Value Ref Range   Magnesium 2.6 (H) 1.7 - 2.4 mg/dL  Comprehensive metabolic panel  Result Value Ref Range   Sodium 130 (L) 135 - 145 mmol/L   Potassium 2.9 (L) 3.5 - 5.1 mmol/L   Chloride  89 (L) 98 - 111 mmol/L   CO2 26 22 - 32 mmol/L   Glucose, Bld 190 (H) 70 - 99 mg/dL   BUN 30 (H) 8 - 23 mg/dL   Creatinine, Ser 1.11 0.61 - 1.24 mg/dL   Calcium 8.0 (L) 8.9 - 10.3 mg/dL   Total Protein 6.9 6.5 - 8.1 g/dL   Albumin 3.2 (L) 3.5 - 5.0 g/dL   AST 237 (H) 15 - 41 U/L   ALT 221 (H) 0 - 44 U/L   Alkaline Phosphatase 118 38 - 126 U/L   Total Bilirubin 2.0 (H) 0.3 - 1.2 mg/dL   GFR calc non Af Amer >60 >60 mL/min   GFR calc Af Amer >60 >60 mL/min   Anion gap 15 5 - 15  Magnesium  Result Value Ref Range   Magnesium 3.0 (H) 1.7 - 2.4 mg/dL  CBG monitoring, ED  Result Value Ref Range   Glucose-Capillary 174 (H) 70 - 99 mg/dL     Murvin Natal, MD [facility]   08/24/2018 12:29 AM How to contact the [facility] Attending or Consulting provider [place] or covering provider during after hours 7P -7A, for this patient?  1. Check the care team in [facility] and look for a) attending/consulting [facility] provider listed and b) the [facility] team listed 2. Log into [email or web address] and use [place] universal password to access. If you do not have the password, please contact the hospital operator. 3. Locate the [facility] provider you are looking for under [facility] and page to a number that you can be directly reached. 4. If you still have  difficulty reaching the provider, please page the [facility] (Director on Call) for the Hospitalists listed on amion for assistance.

## 2018-08-24 NOTE — Consult Note (Addendum)
Referring Provider: Cleora FleetJohnson, Clanford L, MD Primary Care Physician:  Gareth MorganKnowlton, Steve, MD Primary Gastroenterologist:  Roetta SessionsMichael Serapio Edelson, MD   Reason for Consultation:  Acute hepatitis, intractable N/V  HPI: Christopher Fields is a 84 y.o. male with h/o DM, CAD, GERD, HTN, sleep apnea not on CPAP who presented to ED via EMS after nephew visited and became concerned about living conditions. Patient reports poor oral intake for several weeks due to poor appetite and vomiting when he tries to eat. He denies abdominal pain. No hematemesis. Decreased BMs with poor oral intake. BM every 2-3 days. No melena, brbpr. No heartburn. No OTC medications except ASA 81 mg daily.   In ED, CBC normal. Sodium 129, Potassium <2.0, BUN 32, Creatinine 1.32, glucose 220, T bili 2.4, AP 133, AST 268, ALT 246, alb 3.4  RUQ U/S last year for abnormal LFTs was negative. LFTs 02/2016 were normal. No interim LFTs in Epic.  Patient has history of esophageal dysphagia due to esophageal web in the past requiring esophageal dilation in 2015 and 2013. Last EGD 04/2013, probable cervical esophageal web, gastritis. No h.pylori. Last colonoscopy 2008 was normal. Recommended five year follow up due to FH of CRC but never done.           Prior to Admission medications   Medication Sig Start Date End Date Taking? Authorizing Provider  aspirin 81 MG tablet Take 81 mg by mouth daily.      [provider]  atorvastatin (LIPITOR) 80 MG tablet Take 80 mg by mouth daily.    [provider]  gabapentin (NEURONTIN) 100 MG capsule Take 100 mg by mouth 3 (three) times daily.  03/21/13   [provider]  glipiZIDE (GLUCOTROL) 5 MG tablet Take 5 mg by mouth daily before breakfast.  03/31/13   [provider]  HYDROcodone-acetaminophen (NORCO) 5-325 MG tablet Take 1 tablet by mouth every 6 (six) hours as needed for moderate pain. 02/24/16   Cindee SaltKuzma, Gary, MD  metFORMIN (GLUMETZA) 500 MG (MOD) 24 hr tablet  Take 500-1,000 mg by mouth 3 (three) times daily. 2 tablets in the morning, 1 tablet at noon, and 1 tablet in the evening.    [provider]  pantoprazole (PROTONIX) 40 MG tablet Take 40 mg by mouth 2 (two) times daily.     [provider]  potassium chloride SA (K-DUR) 20 MEQ tablet Take 20 mEq by mouth daily.    [provider]             Current Facility-Administered Medications  Medication Dose Route Frequency Provider Last Rate Last Dose  . 0.9 % NaCl with KCl 40 mEq / L  infusion   Intravenous Continuous Bobette Mortiz, David Manuel, MD 100 mL/hr at 08/24/18 0301 100 mL/hr at 08/24/18 0301  . acetaminophen (TYLENOL) tablet 650 mg  650 mg Oral Q6H PRN Bobette Mortiz, David Manuel, MD       Or  . acetaminophen (TYLENOL) suppository 650 mg  650 mg Rectal Q6H PRN Bobette Mortiz, David Manuel, MD      . enoxaparin (LOVENOX) injection 40 mg  40 mg Subcutaneous Q24H Bobette Mortiz, David Manuel, MD             Current Outpatient Medications  Medication Sig Dispense Refill  . aspirin 81 MG tablet Take 81 mg by mouth daily.      Marland Kitchen. atorvastatin (LIPITOR) 80 MG tablet Take 80 mg by mouth daily.    Marland Kitchen. gabapentin (NEURONTIN) 100 MG capsule Take 100 mg by  mouth 3 (three) times daily.     Marland Kitchen. glipiZIDE (GLUCOTROL) 5 MG tablet Take 5 mg by mouth daily before breakfast.     . HYDROcodone-acetaminophen (NORCO) 5-325 MG tablet Take 1 tablet by mouth every 6 (six) hours as needed for moderate pain. 20 tablet 0  . metFORMIN (GLUMETZA) 500 MG (MOD) 24 hr tablet Take 500-1,000 mg by mouth 3 (three) times daily. 2 tablets in the morning, 1 tablet at noon, and 1 tablet in the evening.    . pantoprazole (PROTONIX) 40 MG tablet Take 40 mg by mouth 2 (two) times daily.     . potassium chloride SA (K-DUR) 20 MEQ tablet Take 20 mEq by mouth daily.           Allergies as of 08/24/2018 - Review Complete 08/24/2018  Allergen Reaction Noted  . Penicillins Itching and Rash 12/31/2010         Past Medical History:  Diagnosis Date  . CAD (coronary artery disease)   . Diabetes mellitus   . GERD (gastroesophageal reflux disease)   . Hypertension   . Sleep apnea    Has never used his machine         Past Surgical History:  Procedure Laterality Date  . CARPAL TUNNEL RELEASE Right 02/24/2016   Procedure: RIGHT CARPAL TUNNEL RELEASE;  Surgeon: Cindee SaltKuzma, Gary, MD;  Location: [facility];  Service: Orthopedics;  Laterality: Right;  . COLONOSCOPY  07/01/06   normal colon colonic mucosa appeared normal  . CORONARY ANGIOPLASTY WITH STENT PLACEMENT  2007  . ESOPHAGOGASTRODUODENOSCOPY  01/21/2011   RMR: Hiatal hernia. 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 Gastric erosions-status post biopsy (no h.pyori)  . ESOPHAGOGASTRODUODENOSCOPY (EGD) WITH ESOPHAGEAL DILATION N/A 05/02/2013   Dr. Jena Gaussourk: gastritis, probable cervical esophageal web         Family History  Problem Relation Age of Onset  . Colon cancer Sister        deceased age 82    Social History        Socioeconomic History  . Marital status: Widowed    Spouse name: Not on file  . Number of children: Not on file  . Years of education: Not on file  . Highest education level: Not on file  Occupational History  . Not on file  Social Needs  . Financial resource strain: Not on file  . Food insecurity    Worry: Not on file    Inability: Not on file  . Transportation needs    Medical: Not on file    Non-medical: Not on file  Tobacco Use  . Smoking status: Former Smoker    Packs/day: 0.50    Years: 0.30    Pack years: 0.15    Types: Cigarettes    Start date: 06/04/1946    Quit date: 06/03/1949    Years since quitting: 69.2  . Smokeless tobacco: Never Used  . Tobacco comment: as teenager  Substance and Sexual Activity  . Alcohol use: No  . Drug use: No  . Sexual activity: Not on file  Lifestyle  . Physical activity    Days per week: Not on  file    Minutes per session: Not on file  . Stress: Not on file  Relationships  . Social Musicianconnections    Talks on phone: Not on file    Gets together: Not on file    Attends religious service: Not on file    Active member of club or organization: Not on  file    Attends meetings of clubs or organizations: Not on file    Relationship status: Not on file  . Intimate partner violence    Fear of current or ex partner: Not on file    Emotionally abused: Not on file    Physically abused: Not on file    Forced sexual activity: Not on file  Other Topics Concern  . Not on file  Social History Narrative  . Not on file     ROS:  General: Negative for   fever, chills, fatigue, weakness. See hpi Eyes: Negative for vision changes.  ENT: Negative for hoarseness, difficulty swallowing , nasal congestion. Hard of hearing CV: Negative for chest pain, angina, palpitations, dyspnea on exertion, peripheral edema.  Respiratory: Negative for dyspnea at rest, dyspnea on exertion, cough, sputum, wheezing.  GI: See history of present illness. GU:  Negative for dysuria, hematuria, urinary incontinence, urinary frequency, nocturnal urination.  MS: Negative for joint pain, low back pain.  Derm: Negative for rash or itching.  Neuro: Negative for weakness, abnormal sensation, seizure, frequent headaches, memory loss, confusion.  Psych: Negative for anxiety, depression, suicidal ideation, hallucinations.  Endo: 18 pound weight loss since 01/2017. Heme: Negative for bruising or bleeding. Allergy: Negative for rash or hives.       Physical Examination: Vital signs in last 24 hours: Temp:  [97.8 F (36.6 C)] 97.8 F (36.6 C) (08/06 0032) Pulse Rate:  [53-72] 53 (08/06 0730) Resp:  [12-18] 15 (08/06 0730) BP: (94-163)/(45-74) 94/45 (08/06 0730) SpO2:  [94 %-99 %] 97 % (08/06 0730) Weight:  [62.6 kg] 62.6 kg (08/06 0040)    General: elderly male, very hard of hearing, in NAD.   Head: Normocephalic, atraumatic.   Eyes: Conjunctiva pink, no icterus.  Neck: Supple without thyromegaly, masses, or lymphadenopathy.  Lungs: Clear to auscultation bilaterally.  Heart: Regular rate and rhythm, no murmurs rubs or gallops.  Abdomen: Bowel sounds are normal, nontender, nondistended, no hepatosplenomegaly or masses, no abdominal bruits or    hernia , no rebound or guarding.   Rectal: not performed Extremities: No lower extremity edema, clubbing, deformity.  Neuro: Alert and oriented x 4 , grossly normal neurologically.  Skin: Warm and dry, no rash or jaundice.   Psych: Alert and cooperative, normal mood and affect.        Intake/Output from previous day: No intake/output data recorded. Intake/Output this shift: No intake/output data recorded.  Lab Results: CBC Recent Labs (last 2 labs)      Recent Labs    08/24/18 0052  WBC 8.9  HGB 14.4  HCT 40.3  MCV 81.9  PLT 232     BMET Recent Labs (last 2 labs)      Recent Labs    08/24/18 0052  NA 129*  K <2.0*  CL 85*  CO2 27  GLUCOSE 220*  BUN 32*  CREATININE 1.32*  CALCIUM 8.3*     LFT Recent Labs (last 2 labs)      Recent Labs    08/24/18 0052  BILITOT 2.4*  ALKPHOS 133*  AST 268*  ALT 246*  PROT 7.7  ALBUMIN 3.4*      Lipase Recent Labs (last 2 labs)   No results for input(s): LIPASE in the last 72 hours.    PT/INR Recent Labs (last 2 labs)   No results for input(s): LABPROT, INR in the last 72 hours.      Imaging Studies: Imaging Results  No results found.  Pierre.Alas[4 week]  Impression: Very pleasant 84 y/o male with DM, CAD, HTN, GERD presented to ED via EMS due to concerns about living conditions as well as postprandial vomiting for 3-4 weeks. Patient with documented 18 pound weight loss since 01/2017. Reported abnormal LFTs last year and RUQ U/S unremarkable. Those labs are unavailable for comparison. Noted dehydration and profound electrolyte abnormalities.   Given prior  history of abnormal LFTs it is unclear if recent symptomatology is related. Recommend updated imaging via U/S. Cannot exclude need for upper endoscopy to evaluate postprandial N/V will tentatively schedule for tomorrow.    Plan: 1. Start PPI.  2. LFTs in AM. 3. Abdominal U/S today.  4. Clear liquids after U/S. NPO after midnight.  5. Tentatively plan for EGD tomorrow if U/S unrevealing.  I have discussed the risks, alternatives, benefits with regards to but not limited to the risk of reaction to medication, bleeding, infection, perforation and the patient is agreeable to proceed. Written consent to be obtained.  We would like to thank you for the opportunity to participate in the care of Christopher Fields.     LOS: 0 days                Addendum: Original note inadvertently deleted.  Copy and pasted back into chart.  Laureen Ochs. Bernarda Caffey [facility] [phone] 8/6/20203:56 PM  Attending note:  Agree with above. U/S negative.  Will proceed with an EGD tomorrow.

## 2018-08-24 NOTE — ED Notes (Signed)
Pt states, "Honey I'm sorry if I smell, I haven't washed down there in 3 weeks, because I haven't been able to or felt up to it." Primary nurse, Caitey and Dr Betsey Holiday made aware of this- primary nurse to notify APS.

## 2018-08-24 NOTE — ED Triage Notes (Signed)
Pt brought in by EMS after being called by nephew for concerns related to pt living conditions, lethargy, and decreased appetite. Pt says he has had decreased appetite for about 2-3 weeks. Pt says he "throws up when he eats."  Pt alert and oriented to self, place, and situation.

## 2018-08-25 ENCOUNTER — Encounter (HOSPITAL_COMMUNITY): Payer: Self-pay | Admitting: *Deleted

## 2018-08-25 ENCOUNTER — Encounter (HOSPITAL_COMMUNITY)
Admission: EM | Disposition: A | Discharge status: Home / Self Care | Payer: Self-pay | Source: Home / Self Care | Attending: Family Medicine

## 2018-08-25 ENCOUNTER — Other Ambulatory Visit: Payer: Self-pay

## 2018-08-25 DIAGNOSIS — R6881 Early satiety: Secondary | ICD-10-CM

## 2018-08-25 DIAGNOSIS — R1310 Dysphagia, unspecified: Secondary | ICD-10-CM

## 2018-08-25 DIAGNOSIS — K449 Diaphragmatic hernia without obstruction or gangrene: Secondary | ICD-10-CM

## 2018-08-25 DIAGNOSIS — E871 Hypo-osmolality and hyponatremia: Secondary | ICD-10-CM

## 2018-08-25 DIAGNOSIS — I10 Essential (primary) hypertension: Secondary | ICD-10-CM

## 2018-08-25 DIAGNOSIS — K219 Gastro-esophageal reflux disease without esophagitis: Secondary | ICD-10-CM

## 2018-08-25 HISTORY — PX: ESOPHAGEAL DILATION: SHX303

## 2018-08-25 HISTORY — PX: ESOPHAGOGASTRODUODENOSCOPY: SHX5428

## 2018-08-25 LAB — GLUCOSE, CAPILLARY
Glucose-Capillary: 119 mg/dL — ABNORMAL HIGH (ref 70–99)
Glucose-Capillary: 106 mg/dL — ABNORMAL HIGH (ref 70–99)
Glucose-Capillary: 110 mg/dL — ABNORMAL HIGH (ref 70–99)
Glucose-Capillary: 237 mg/dL — ABNORMAL HIGH (ref 70–99)
Glucose-Capillary: 261 mg/dL — ABNORMAL HIGH (ref 70–99)

## 2018-08-25 LAB — COMPREHENSIVE METABOLIC PANEL
ALT: 190 U/L — ABNORMAL HIGH (ref 0–44)
AST: 202 U/L — ABNORMAL HIGH (ref 15–41)
Albumin: 3 g/dL — ABNORMAL LOW (ref 3.5–5.0)
Alkaline Phosphatase: 107 U/L (ref 38–126)
Anion gap: 13 (ref 5–15)
BUN: 23 mg/dL (ref 8–23)
CO2: 24 mmol/L (ref 22–32)
Calcium: 7.9 mg/dL — ABNORMAL LOW (ref 8.9–10.3)
Chloride: 100 mmol/L (ref 98–111)
Creatinine, Ser: 0.93 mg/dL (ref 0.61–1.24)
GFR calc Af Amer: >60 mL/min (ref >60)
GFR calc non Af Amer: >60 mL/min (ref >60)
Glucose, Bld: 126 mg/dL — ABNORMAL HIGH (ref 70–99)
Potassium: 3.1 mmol/L — ABNORMAL LOW (ref 3.5–5.1)
Sodium: 137 mmol/L (ref 135–145)
Total Bilirubin: 1.8 mg/dL — ABNORMAL HIGH (ref 0.3–1.2)
Total Protein: 6.4 g/dL — ABNORMAL LOW (ref 6.5–8.1)

## 2018-08-25 LAB — CBC
HCT: 36.5 % — ABNORMAL LOW (ref 39.0–52.0)
Hemoglobin: 12.5 g/dL — ABNORMAL LOW (ref 13.0–17.0)
MCH: 28.9 pg (ref 26.0–34.0)
MCHC: 34.2 g/dL (ref 30.0–36.0)
MCV: 84.3 fL (ref 80.0–100.0)
Platelets: 174 10*3/uL (ref 150–400)
RBC: 4.33 MIL/uL (ref 4.22–5.81)
RDW: 12.8 % (ref 11.5–15.5)
WBC: 6.1 10*3/uL (ref 4.0–10.5)
nRBC: 0 % (ref 0.0–0.2)

## 2018-08-25 LAB — HEPATITIS PANEL, ACUTE
HCV Ab: <0.1 s/co ratio (ref 0.0–0.9)
Hep A IgM: NEGATIVE
Hep B C IgM: NEGATIVE
Hepatitis B Surface Ag: NEGATIVE

## 2018-08-25 LAB — MAGNESIUM: Magnesium: 2.4 mg/dL (ref 1.7–2.4)

## 2018-08-25 SURGERY — EGD (ESOPHAGOGASTRODUODENOSCOPY)
Anesthesia: Moderate Sedation

## 2018-08-25 MED ORDER — LIDOCAINE VISCOUS HCL 2 % MT SOLN
OROMUCOSAL | Status: DC | PRN
  Administered 2018-08-25: 1 via OROMUCOSAL

## 2018-08-25 MED ORDER — LIDOCAINE VISCOUS HCL 2 % MT SOLN
OROMUCOSAL | Status: AC
  Filled 2018-08-25: qty 15

## 2018-08-25 MED ORDER — MIDAZOLAM HCL 5 MG/5ML IJ SOLN
INTRAMUSCULAR | Status: DC | PRN
  Administered 2018-08-25: 1 mg via INTRAVENOUS

## 2018-08-25 MED ORDER — MIDAZOLAM HCL 5 MG/5ML IJ SOLN
INTRAMUSCULAR | Status: AC
  Filled 2018-08-25: qty 10

## 2018-08-25 MED ORDER — ONDANSETRON HCL 4 MG/2ML IJ SOLN
INTRAMUSCULAR | Status: DC | PRN
  Administered 2018-08-25: 4 mg via INTRAVENOUS

## 2018-08-25 MED ORDER — MEPERIDINE HCL 100 MG/ML IJ SOLN
INTRAMUSCULAR | Status: DC | PRN
  Administered 2018-08-25: 25 mg

## 2018-08-25 MED ORDER — MEPERIDINE HCL 50 MG/ML IJ SOLN
INTRAMUSCULAR | Status: AC
  Filled 2018-08-25: qty 1

## 2018-08-25 MED ORDER — POTASSIUM CHLORIDE 10 MEQ/100ML IV SOLN: 10.0000 meq | INTRAVENOUS | Status: AC

## 2018-08-25 MED ORDER — ONDANSETRON HCL 4 MG/2ML IJ SOLN
INTRAMUSCULAR | Status: AC
  Filled 2018-08-25: qty 2

## 2018-08-25 MED ORDER — SODIUM CHLORIDE 0.9 % IV SOLN
INTRAVENOUS | Status: DC
  Administered 2018-08-25: 10:00:00 via INTRAVENOUS

## 2018-08-25 NOTE — Progress Notes (Signed)
Pt had 8.4 second pause, bradycardia that came right back to sinus rhythm during procedure. Pt had episode of bradycardia and pause last EGD. RN and MD aware. Pt is asymptomatic at this time.   Christopher Faul Rica Mote, RN

## 2018-08-25 NOTE — Care Management Important Message (Signed)
Important Message  Patient Details  Name: Christopher Fields MRN: [id number] Date of Birth: 10-10-1934   Medicare Important Message Given:  Yes     Tommy Medal 08/25/2018, 1:04 PM
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
## 2018-08-25 NOTE — Interval H&P Note (Signed)
History and Physical Interval Note:  08/25/2018 11:04 AM  Christopher Fields  has presented today for surgery, with the diagnosis of N/V, weight loss, H/O esophageal web.  The various methods of treatment have been discussed with the patient and family. After consideration of risks, benefits and other options for treatment, the patient has consented to  Procedure(s): ESOPHAGOGASTRODUODENOSCOPY (EGD) (N/A) ESOPHAGEAL DILATION (N/A) as a surgical intervention.  The patient's history has been reviewed, patient examined, no change in status, stable for surgery.  I have reviewed the patient's chart and labs.  Questions were answered to the patient's satisfaction.     Harrietta Incorvaia  No change overnight.  LFTs coming down.  Plan for EGD with esophageal dilation as feasible/appropriate per plan.  The risks, benefits, limitations, alternatives and imponderables have been reviewed with the patient. Potential for esophageal dilation, biopsy, etc. have also been reviewed.  Questions have been answered. All parties agreeable.
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
## 2018-08-25 NOTE — Op Note (Signed)
[facility] Patient Name: Christopher Fields Procedure Date: 08/25/2018 10:41 AM MRN: [id number] Date of Birth: [birth year] Attending MD: Gennette Pacobert Michael Aqib Lough , MD CSN: [id number] Age: 84 Admit Type: Outpatient Procedure:                Upper GI endoscopy Indications:              Dysphagia, Early satiety N and V Providers:                Gennette Pacobert Michael Hendry Speas, MD, Sterling Bigiffani Roberts, RN,                            Dyann Ruddleonya Wilson Referring MD:              Medicines:                Midazolam 1 mg IV, Meperidine 25 mg IV, Ondansetron                            4 mg IV Complications:            No immediate complications. Estimated Blood Loss:     Estimated blood loss was minimal. Estimated blood                            loss was minimal. Procedure:                Pre-Anesthesia Assessment:                           - Prior to the procedure, a History and Physical                            was performed, and patient medications and                            allergies were reviewed. The patient's tolerance of                            previous anesthesia was also reviewed. The risks                            and benefits of the procedure and the sedation                            options and risks were discussed with the patient.                            All questions were answered, and informed consent                            was obtained. Prior Anticoagulants: The patient has                            taken no previous anticoagulant or antiplatelet  agents. ASA Grade Assessment: III - A patient with                            severe systemic disease. After reviewing the risks                            and benefits, the patient was deemed in                            satisfactory condition to undergo the procedure.                           After obtaining informed consent, the endoscope was                            passed under direct vision.  Throughout the                            procedure, the patient's blood pressure, pulse, and                            oxygen saturations were monitored continuously. The                            [id number]-[id number] ([id number]) was introduced through the                            mouth, and advanced to the second part of duodenum.                            The upper GI endoscopy was accomplished without                            difficulty. The patient tolerated the procedure                            well. Scope In: 11:12:33 AM Scope Out: 11:17:45 AM Total Procedure Duration: 0 hours 5 minutes 12 seconds  Findings:      The examined esophagus was normal.      A small hiatal hernia was present.      The exam was otherwise without abnormality. The scope was withdrawn.       Dilation was performed with a Maloney dilator with mild resistance at 56       Fr. The dilation site was examined following endoscope reinsertion and       showed no change. Estimated blood loss was minimal. Impression:               - Normal esophagus. [facility].                            Cannot exclude an occult cervical esophageal web.                           - Small hiatal hernia.                           -  The examination was otherwise normal.                           - No specimens collected. LFTs are normalizing.                            Etiology of elevation not clear. Pattern not                            consistent with an ischemic event. Transient                            obstruction not excluded. Trend towards                            normalization is reassuring. Moderate Sedation:      Moderate (conscious) sedation was administered by the endoscopy nurse       and supervised by the endoscopist. The following parameters were       monitored: oxygen saturation, heart rate, blood pressure, respiratory       rate, EKG, adequacy of pulmonary ventilation, and response to care.        Total physician intraservice time was 8 minutes. Recommendation:           - Return patient to hospital ward for ongoing care.                           - Diabetic (ADA) diet.                           - Continue present medications. Repeat liver                            function studies on 08/27/2018. Had a lengthy                            discussion with the daughter, Christopher Fields,                            [phone]. She feels patient needs closer                            supervision. She [place] Procedure Code(s):        --- Professional ---                           [phone], Esophagogastroduodenoscopy, flexible,                            transoral; diagnostic, including collection of                            specimen(s) by brushing or washing, when  performed                            (separate procedure)                           43450, Dilation of esophagus, by unguided sound or                            bougie, single or multiple passes Diagnosis Code(s):        --- Professional ---                           K44.9, Diaphragmatic hernia without obstruction or                            gangrene                           R13.10, Dysphagia, unspecified                           R68.81, Early satiety CPT copyright 2019 American Medical Association. All rights reserved. The codes documented in this report are preliminary and upon coder review may  be revised to meet current compliance requirements. [name]. Kenwood Rosiak, MD Norvel Richards, MD 08/25/2018 11:35:59 AM This report has been signed electronically. Number of Addenda: 0

## 2018-08-26 DIAGNOSIS — R1310 Dysphagia, unspecified: Secondary | ICD-10-CM

## 2018-08-26 DIAGNOSIS — K449 Diaphragmatic hernia without obstruction or gangrene: Secondary | ICD-10-CM

## 2018-08-26 DIAGNOSIS — K219 Gastro-esophageal reflux disease without esophagitis: Secondary | ICD-10-CM

## 2018-08-26 DIAGNOSIS — R945 Abnormal results of liver function studies: Secondary | ICD-10-CM

## 2018-08-26 DIAGNOSIS — E785 Hyperlipidemia, unspecified: Secondary | ICD-10-CM

## 2018-08-26 LAB — COMPREHENSIVE METABOLIC PANEL
ALT: 134 U/L — ABNORMAL HIGH (ref 0–44)
AST: 135 U/L — ABNORMAL HIGH (ref 15–41)
Albumin: 2.6 g/dL — ABNORMAL LOW (ref 3.5–5.0)
Alkaline Phosphatase: 90 U/L (ref 38–126)
Anion gap: 9 (ref 5–15)
BUN: 15 mg/dL (ref 8–23)
CO2: 25 mmol/L (ref 22–32)
Calcium: 7.5 mg/dL — ABNORMAL LOW (ref 8.9–10.3)
Chloride: 104 mmol/L (ref 98–111)
Creatinine, Ser: 0.8 mg/dL (ref 0.61–1.24)
GFR calc Af Amer: >60 mL/min (ref >60)
GFR calc non Af Amer: >60 mL/min (ref >60)
Glucose, Bld: 174 mg/dL — ABNORMAL HIGH (ref 70–99)
Potassium: 3.5 mmol/L (ref 3.5–5.1)
Sodium: 138 mmol/L (ref 135–145)
Total Bilirubin: 1.1 mg/dL (ref 0.3–1.2)
Total Protein: 5.5 g/dL — ABNORMAL LOW (ref 6.5–8.1)

## 2018-08-26 LAB — GLUCOSE, CAPILLARY
Glucose-Capillary: 135 mg/dL — ABNORMAL HIGH (ref 70–99)
Glucose-Capillary: 233 mg/dL — ABNORMAL HIGH (ref 70–99)
Glucose-Capillary: 304 mg/dL — ABNORMAL HIGH (ref 70–99)
Glucose-Capillary: 175 mg/dL — ABNORMAL HIGH (ref 70–99)

## 2018-08-26 LAB — MAGNESIUM: Magnesium: 2 mg/dL (ref 1.7–2.4)

## 2018-08-26 MED ORDER — INSULIN ASPART 100 UNIT/ML ~~LOC~~ SOLN: 0.0000 [IU] | Freq: Every day | SUBCUTANEOUS | Status: DC

## 2018-08-26 MED ORDER — INSULIN ASPART 100 UNIT/ML ~~LOC~~ SOLN
0.0000 [IU] | Freq: Three times a day (TID) | SUBCUTANEOUS | Status: DC
  Administered 2018-08-26 – 2018-08-27 (×2): 11 [IU] via SUBCUTANEOUS
  Administered 2018-08-27: 2 [IU] via SUBCUTANEOUS

## 2018-08-26 NOTE — Progress Notes (Signed)
PT Cancellation Note  Patient Details Name: COLDEN SAMARAS MRN: [id number] DOB: [birth year]   Cancelled Treatment:    Reason Eval/Treat Not Completed: Patient declined, no reason specified. Nurse tech reports pt just returned to bed from bath and changing, was very fatigued, and just fell asleep. Will check on pt later today or tomorrow.   Tori Treniece Holsclaw PT, DPT 08/26/18, 10:14 AM [phone]
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
## 2018-08-26 NOTE — Progress Notes (Signed)
  Subjective:  Patient has no complaints.  His daughter Ivin Booty who is at bedside states he has had dry cough.  Patient states he can swallow much better since esophageal dilation yesterday.  He denies abdominal pain.  He states he has good appetite.  Objective: Blood pressure (!) 114/57, pulse 61, temperature 98.1 F (36.7 C), temperature source Oral, resp. rate 18, height _0  (1.676 m), weight 62.7 kg, SpO2 95 %. Patient is alert and appears to be comfortable sitting in a reclining chair. His abdomen is protuberant but soft and nontender with organomegaly or masses.  Labs/studies Results:  CBC Latest Ref Rng & Units 08/25/2018 08/24/2018 06/06/2007  WBC 4.0 - 10.5 K/uL 6.1 8.9 6.3  Hemoglobin 13.0 - 17.0 g/dL 12.5(L) 14.4 15.5  Hematocrit 39.0 - 52.0 % 36.5(L) 40.3 46.2  Platelets 150 - 400 K/uL 174 232 233    CMP Latest Ref Rng & Units 08/26/2018 08/25/2018 08/24/2018  Glucose 70 - 99 mg/dL 174(H) 126(H) 190(H)  BUN 8 - 23 mg/dL 15 23 30(H)  Creatinine 0.61 - 1.24 mg/dL 0.80 0.93 1.11  Sodium 135 - 145 mmol/L 138 137 130(L)  Potassium 3.5 - 5.1 mmol/L 3.5 3.1(L) 2.9(L)  Chloride 98 - 111 mmol/L 104 100 89(L)  CO2 22 - 32 mmol/L _1 Calcium 8.9 - 10.3 mg/dL 7.5(L) 7.9(L) 8.0(L)  Total Protein 6.5 - 8.1 g/dL 5.5(L) 6.4(L) 6.9  Total Bilirubin 0.3 - 1.2 mg/dL 1.1 1.8(H) 2.0(H)  Alkaline Phos 38 - 126 U/L 90 107 118  AST 15 - 41 U/L 135(H) 202(H) 237(H)  ALT 0 - 44 U/L 134(H) 190(H) 221(H)    Hepatic Function Latest Ref Rng & Units 08/26/2018 08/25/2018 08/24/2018  Total Protein 6.5 - 8.1 g/dL 5.5(L) 6.4(L) 6.9  Albumin 3.5 - 5.0 g/dL 2.6(L) 3.0(L) 3.2(L)  AST 15 - 41 U/L 135(H) 202(H) 237(H)  ALT 0 - 44 U/L 134(H) 190(H) 221(H)  Alk Phosphatase 38 - 126 U/L 90 107 118  Total Bilirubin 0.3 - 1.2 mg/dL 1.1 1.8(H) 2.0(H)      Assessment:  #1.  Nausea and vomiting resolved.  Most likely due to gastroenteritis. EGD yesterday did not reveal any evidence of peptic ulcer disease.  #2.   Elevated transaminases.  Continued drop in AST and ALT and bilirubin is now normal.  Markers for hepatitis A, B, and C pending.  Ultrasound negative for cholelithiasis or dilated bile duct.  Atorvastatin has been discontinued.  Elevated transaminases either secondary to atorvastatin or viral gastroenteritis.  No further work-up at this time other than follow-up until LFTs are returned to normal.  #3.  Esophageal dysphagia.  Patient underwent esophageal dilation yesterday.  No structural abnormality noted but esophageal web suspected.  Patient is able to swallow much better.  #4.  Disposition.  Patient's daughter Ivin Booty is planning to take her dad with her to [place].  She wants all his prescription sent [facility].   Recommendations:  Repeat LFTs in 2 weeks. Will sign off.

## 2018-08-27 LAB — COMPREHENSIVE METABOLIC PANEL
ALT: 116 U/L — ABNORMAL HIGH (ref 0–44)
AST: 121 U/L — ABNORMAL HIGH (ref 15–41)
Albumin: 2.5 g/dL — ABNORMAL LOW (ref 3.5–5.0)
Alkaline Phosphatase: 85 U/L (ref 38–126)
Anion gap: 7 (ref 5–15)
BUN: 12 mg/dL (ref 8–23)
CO2: 27 mmol/L (ref 22–32)
Calcium: 7.5 mg/dL — ABNORMAL LOW (ref 8.9–10.3)
Chloride: 103 mmol/L (ref 98–111)
Creatinine, Ser: 0.73 mg/dL (ref 0.61–1.24)
GFR calc Af Amer: >60 mL/min (ref >60)
GFR calc non Af Amer: >60 mL/min (ref >60)
Glucose, Bld: 120 mg/dL — ABNORMAL HIGH (ref 70–99)
Potassium: 3.2 mmol/L — ABNORMAL LOW (ref 3.5–5.1)
Sodium: 137 mmol/L (ref 135–145)
Total Bilirubin: 1.1 mg/dL (ref 0.3–1.2)
Total Protein: 5.4 g/dL — ABNORMAL LOW (ref 6.5–8.1)

## 2018-08-27 LAB — MAGNESIUM: Magnesium: 1.6 mg/dL — ABNORMAL LOW (ref 1.7–2.4)

## 2018-08-27 LAB — GLUCOSE, CAPILLARY
Glucose-Capillary: 132 mg/dL — ABNORMAL HIGH (ref 70–99)
Glucose-Capillary: 338 mg/dL — ABNORMAL HIGH (ref 70–99)

## 2018-08-27 MED ORDER — METFORMIN HCL ER (MOD) 500 MG PO TB24: 500.0000 mg | ORAL_TABLET | ORAL | 0 refills | Status: DC

## 2018-08-27 MED ORDER — POTASSIUM CHLORIDE CRYS ER 20 MEQ PO TBCR: 20.0000 meq | EXTENDED_RELEASE_TABLET | Freq: Every day | ORAL | 0 refills | Status: DC

## 2018-08-27 MED ORDER — GLIPIZIDE 5 MG PO TABS: 5.0000 mg | ORAL_TABLET | Freq: Every day | ORAL | 0 refills | Status: DC

## 2018-08-27 MED ORDER — POTASSIUM CHLORIDE CRYS ER 20 MEQ PO TBCR
60.0000 meq | EXTENDED_RELEASE_TABLET | Freq: Once | ORAL | Status: AC
  Administered 2018-08-27: 60 meq via ORAL
  Filled 2018-08-27: qty 3

## 2018-08-27 MED ORDER — MAGNESIUM SULFATE 4 GM/100ML IV SOLN
4.0000 g | Freq: Once | INTRAVENOUS | Status: AC
  Administered 2018-08-27: 4 g via INTRAVENOUS
  Filled 2018-08-27: qty 100

## 2018-08-27 MED ORDER — GABAPENTIN 100 MG PO CAPS: 100.0000 mg | ORAL_CAPSULE | Freq: Three times a day (TID) | ORAL | 0 refills | Status: DC | PRN

## 2018-08-27 MED ORDER — PANTOPRAZOLE SODIUM 40 MG PO TBEC: 40.0000 mg | DELAYED_RELEASE_TABLET | Freq: Every day | ORAL | 0 refills | Status: AC

## 2018-08-27 NOTE — Discharge Instructions (Signed)
Nausea and Vomiting, Adult °Nausea is feeling sick to your stomach or feeling that you are about to throw up (vomit). Vomiting is when food in your stomach is thrown up and out of the mouth. Throwing up can make you feel weak. It can also make you lose too much water in your body (get dehydrated). If you lose too much water in your body, you may: °· Feel tired. °· Feel thirsty. °· Have a dry mouth. °· Have cracked lips. °· Go pee (urinate) less often. °Older adults and people with other diseases or a weak body defense system (immune system) are at higher risk for losing too much water in the body. If you feel sick to your stomach and you throw up, it is important to follow instructions from your doctor about how to take care of yourself. °Follow these instructions at home: °Watch your symptoms for any changes. Tell your doctor about them. Follow these instructions to care for yourself at home. °Eating and drinking ° °  ° °· Take an ORS (oral rehydration solution). This is a drink that is sold at pharmacies and stores. °· Drink clear fluids in small amounts as you are able, such as: °? Water. °? Ice chips. °? Fruit juice that has water added (diluted fruit juice). °? Low-calorie sports drinks. °· Eat bland, easy-to-digest foods in small amounts as you are able, such as: °? Bananas. °? Applesauce. °? Rice. °? Low-fat (lean) meats. °? Toast. °? Crackers. °· Avoid drinking fluids that have a lot of sugar or caffeine in them. This includes energy drinks, sports drinks, and soda. °· Avoid alcohol. °· Avoid spicy or fatty foods. °General instructions °· Take over-the-counter and prescription medicines only as told by your doctor. °· Drink enough fluid to keep your pee (urine) pale yellow. °· Wash your hands often with soap and water. If you cannot use soap and water, use hand sanitizer. °· Make sure that all people in your home wash their hands well and often. °· Rest at home while you get better. °· Watch your condition  for any changes. °· Take slow and deep breaths when you feel sick to your stomach. °· Keep all follow-up visits as told by your doctor. This is important. °Contact a doctor if: °· Your symptoms get worse. °· You have new symptoms. °· You have a fever. °· You cannot drink fluids without throwing up. °· You feel sick to your stomach for more than 2 days. °· You feel light-headed or dizzy. °· You have a headache. °· You have muscle cramps. °· You have a rash. °· You have pain while peeing. °Get help right away if: °· You have pain in your chest, neck, arm, or jaw. °· You feel very weak or you pass out (faint). °· You throw up again and again. °· You have throw up that is bright red or looks like black coffee grounds. °· You have bloody or black poop (stools) or poop that looks like tar. °· You have a very bad headache, a stiff neck, or both. °· You have very bad pain, cramping, or bloating in your belly (abdomen). °· You have trouble breathing. °· You are breathing very quickly. °· Your heart is beating very quickly. °· Your skin feels cold and clammy. °· You feel confused. °· You have signs of losing too much water in your body, such as: °? Dark pee, very little pee, or no pee. °? Cracked lips. °? Dry mouth. °? Sunken eyes. °?   Sleepiness. ? Weakness. These symptoms may be an emergency. Do not wait to see if the symptoms will go away. Get medical help right away. Call your local emergency services (911 in the [place] Do not drive yourself to the hospital. Summary  Nausea is feeling sick to your stomach or feeling that you are about to throw up (vomit). Vomiting is when food in your stomach is thrown up and out of the mouth.  Follow instructions from your doctor about eating and drinking to keep from losing too much water in your body.  Take over-the-counter and prescription medicines only as told by your doctor.  Contact your doctor if your symptoms get worse or you have new symptoms.  Keep all follow-up  visits as told by your doctor. This is important. This information is not intended to replace advice given to you by your health care provider. Make sure you discuss any questions you have with your health care provider. Document Released: 06/23/2007 Document Revised: 04/28/2018 Document Reviewed: 06/14/2017 Elsevier Patient Education  2020 [place] INFORMATION: PAY CLOSE ATTENTION   PHYSICIAN DISCHARGE INSTRUCTIONS  Follow with Primary care provider  Lemmie Evens, MD  and other consultants as instructed by your Hospitalist Physician  [place] IF SYMPTOMS COME BACK, WORSEN OR NEW PROBLEM DEVELOPS   Please note: You were cared for by a hospitalist during your hospital stay. Every effort will be made to forward records to your primary care provider.  You can request that your primary care provider send for your hospital records if they have not received them.  Once you are discharged, your primary care physician will handle any further medical issues. Please note that NO REFILLS for any discharge medications will be authorized once you are discharged, as it is imperative that you return to your primary care physician (or establish a relationship with a primary care physician if you do not have one) for your post hospital discharge needs so that they can reassess your need for medications and monitor your lab values.  Please get a complete blood count and chemistry panel checked by your Primary MD at your next visit, and again as instructed by your Primary MD.  Get Medicines reviewed and adjusted: Please take all your medications with you for your next visit with your Primary MD  Laboratory/radiological data: Please request your Primary MD to go over all hospital tests and procedure/radiological results at the follow up, please ask your primary care provider to get all Hospital records sent to his/her office.  In some cases, they will be  blood work, cultures and biopsy results pending at the time of your discharge. Please request that your primary care provider follow up on these results.  If you are diabetic, please bring your blood sugar readings with you to your follow up appointment with primary care.    Please call and make your follow up appointments as soon as possible.    Also Note the following: If you experience worsening of your admission symptoms, develop shortness of breath, life threatening emergency, suicidal or homicidal thoughts you must seek medical attention immediately by calling 911 or calling your MD immediately  if symptoms less severe.  You must read complete instructions/literature along with all the possible adverse reactions/side effects for all the Medicines you take and that have been prescribed to you. Take any new Medicines after you have completely understood and accpet all the possible adverse reactions/side effects.  Do not drive when taking Pain medications or sleeping medications (Benzodiazepines)  Do not take more than prescribed Pain, Sleep and Anxiety Medications. It is not advisable to combine anxiety,sleep and pain medications without talking with your primary care practitioner  Special Instructions: If you have smoked or chewed Tobacco  in the last 2 yrs please stop smoking, stop any regular Alcohol  and or any Recreational drug use.  Wear Seat belts while driving.  Do not drive if taking any narcotic, mind altering or controlled substances or recreational drugs or alcohol.

## 2018-08-27 NOTE — Evaluation (Signed)
Physical Therapy Evaluation Patient Details Name: Christopher Fields MRN: [id number] DOB: February 20, 1934 Today's Date: 08/27/2018   History of Present Illness  Christopher Fields is a 84 y.o. male with medical history significant of CAD, type 2 diabetes, GERD, hypertension, sleep apnea not on CPAP who is coming to the emergency department due to decreased appetite and frequent vomiting after meals.  He denies abdominal pain, diarrhea, constipation, melena or hematochezia.  No dysuria, frequency or hematuria.  Headache, fever, chills, sore throat, rhinorrhea, productive cough, dyspnea, chest pain, palpitations, diaphoresis, PND or orthopnea.  Denies polyuria, polydipsia, polyphagia or blurred vision.    Clinical Impression  Pt very HOH, but agreeable to therapy. Pt able to transfer using RW with proper hand placement and good balance. Pt ambulates around room and in hallway with RW, requiring increased time and steps for turns and decreased speed navigating tight spaces requiring SUPV for safety. Pt reports he plans to temporarily move in with daughter in [place], but she works part time during the day so he will spend that time at his sisters house in the same city. Pt overall, mod ind for all mobility requiring increased time to complete, but would benefit from HHPT to further improve strength, balance, gait mechanics, and overall safety with navigating home environment and return to independent PLOF. Pt also reports occasionally using deceased spouse's RW, but does not own his own and would benefit from one until back to PLOF for safety, balance, and long distance ambulation. Physical therapy evaluation completed, patient is slightly below baseline, recommending HHPT. Patient discharged to care of nursing for ambulation daily as tolerated for length of stay.     Follow Up Recommendations Home health PT;Supervision - Intermittent    Equipment Recommendations  Rolling walker with 5" wheels     Recommendations for Other Services       Precautions / Restrictions Precautions Precautions: Fall Restrictions Weight Bearing Restrictions: No      Mobility  Bed Mobility Overal bed mobility: Modified Independent             General bed mobility comments: slow movement, use of bedrail  Transfers Overall transfer level: Modified independent Equipment used: Rolling walker (2 wheeled)             General transfer comment: slow movement, steadiness upon standing  Ambulation/Gait Ambulation/Gait assistance: Modified independent (Device/Increase time);Supervision Gait Distance (Feet): 150 Feet Assistive device: Rolling walker (2 wheeled) Gait Pattern/deviations: WFL(Within Functional Limits);Step-through pattern Gait velocity: slightly decreased   General Gait Details: requires decreased speed with tight spaces and increased circumference with turns requiring SUPV, but otherwise steady and no loss of balance with ambulation  Stairs            Wheelchair Mobility    Modified Rankin (Stroke Patients Only)       Balance Overall balance assessment: Needs assistance Sitting-balance support: Feet supported;No upper extremity supported Sitting balance-Leahy Scale: Good Sitting balance - Comments: seated EOB   Standing balance support: During functional activity;Bilateral upper extremity supported Standing balance-Leahy Scale: Fair Standing balance comment: good/fair with RW                             Pertinent Vitals/Pain Pain Assessment: No/denies pain    Home Living Family/patient expects to be discharged to:: Private residence(Daughter's house on the [place] of [place]) Living Arrangements: Children;Other relatives Available Help at Discharge: Family Type of Home: House Home Access: Stairs  to enter Entrance Stairs-Rails: Right Entrance Stairs-Number of Steps: 2 Home Layout: One level Home Equipment: None      Prior Function  Level of Independence: Independent;Independent with assistive device(s)         Comments: Pt reports ind with self care, household chores, eats out, driving, ambulation in the home and community, randomly uses deceased spouse's RW. Pt reports sleeping in deceased spouse's hospital bed at home and will be in regular twin bed at daughter's house.     Hand Dominance        Extremity/Trunk Assessment   Upper Extremity Assessment Upper Extremity Assessment: Overall WFL for tasks assessed    Lower Extremity Assessment Lower Extremity Assessment: Overall WFL for tasks assessed    Cervical / Trunk Assessment Cervical / Trunk Assessment: Normal  Communication   Communication: No difficulties  Cognition Arousal/Alertness: Awake/alert Behavior During Therapy: WFL for tasks assessed/performed Overall Cognitive Status: Within Functional Limits for tasks assessed                                        General Comments      Exercises     Assessment/Plan    PT Assessment All further PT needs can be met in the next venue of care  PT Problem List Decreased strength;Decreased balance;Decreased safety awareness       PT Treatment Interventions      PT Goals (Current goals can be found in the Care Plan section)  Acute Rehab PT Goals Patient Stated Goal: go to daughter's house to continue getting stronger PT Goal Formulation: With patient Time For Goal Achievement: 08/27/18 Potential to Achieve Goals: Good    Frequency     Barriers to discharge        Co-evaluation               AM-PAC PT "6 Clicks" Mobility  Outcome Measure Help needed turning from your back to your side while in a flat bed without using bedrails?: None Help needed moving from lying on your back to sitting on the side of a flat bed without using bedrails?: None Help needed moving to and from a bed to a chair (including a wheelchair)?: None Help needed standing up from a chair using  your arms (e.g., wheelchair or bedside chair)?: None Help needed to walk in hospital room?: None Help needed climbing 3-5 steps with a railing? : A Little 6 Click Score: 23    End of Session Equipment Utilized During Treatment: Gait belt Activity Tolerance: Patient tolerated treatment well Patient left: in bed;with call bell/phone within reach;with bed alarm set Nurse Communication: Mobility status PT Visit Diagnosis: Other abnormalities of gait and mobility (R26.89)    Time: 4332-9518 PT Time Calculation (min) (ACUTE ONLY): 17 min   Charges:   PT Evaluation $PT Eval Low Complexity: 1 Low          Tori Harriette Tovey PT, DPT 08/27/18, 9:59 AM [phone]

## 2018-08-27 NOTE — Progress Notes (Signed)
IV, telemetry removed. D/C instructions reviewed with patient and daughter. Verbalized understanding. HH to be set up for delivery to [facility] when daughter takes patient to live with her there. Verbalized understanding. Patient transported to private vehicle via wheelchair,
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
## 2018-08-28 NOTE — Care Management (Signed)
TOC team was consulted to assist with DME needs. Upon speaking to patients daughter she will be taking patient to her home near [place], [place] sometime this week possibly Tuesday or Wednesay, "depending on when he ready to travel". Her preference would be to have to DME delivered to the home in [place] since it wouldn't be possible for her to transport a hospital bed to [place]. RNCM reached out to [employer] and per the rep they DO cover and service that area but they would need to contact that branch during business hours and suggest our team contact Juliann Pulse, who is the rep for [employer].  I communicated this to the bedside RN and she spoke to MD and he and the nursing supervisor feel we need to continue with discharge as patient is medically cleared. This Probation officer made it clear that it may not be possible to get this DME ordered correctly if the patient is discharge prior to speaking with [facility] Monday. I did ensure DME was ordered in case they could pull the order from Epic post discharge. RNCM informed [employer] leadership to coordinate follow up to see if DME could be obtained Monday although patient is discharged.

## 2018-08-29 ENCOUNTER — Encounter (HOSPITAL_COMMUNITY): Payer: Self-pay | Admitting: Internal Medicine

## 2023-01-08 ENCOUNTER — Emergency Department: Payer: Medicare Other

## 2023-01-08 ENCOUNTER — Emergency Department
Admission: EM | Admit: 2023-01-08 | Discharge: 2023-01-08 | Disposition: A | Discharge status: Home / Self Care | Payer: Medicare Other | Attending: Emergency Medicine | Admitting: Emergency Medicine

## 2023-01-08 ENCOUNTER — Other Ambulatory Visit: Payer: Self-pay

## 2023-01-08 ENCOUNTER — Encounter: Payer: Self-pay | Admitting: Pharmacy Technician

## 2023-01-08 DIAGNOSIS — S0990XA Unspecified injury of head, initial encounter: Secondary | ICD-10-CM | POA: Diagnosis present

## 2023-01-08 DIAGNOSIS — E119 Type 2 diabetes mellitus without complications: Secondary | ICD-10-CM | POA: Diagnosis not present

## 2023-01-08 DIAGNOSIS — W19XXXA Unspecified fall, initial encounter: Secondary | ICD-10-CM

## 2023-01-08 DIAGNOSIS — I2510 Atherosclerotic heart disease of native coronary artery without angina pectoris: Secondary | ICD-10-CM | POA: Insufficient documentation

## 2023-01-08 DIAGNOSIS — I10 Essential (primary) hypertension: Secondary | ICD-10-CM | POA: Diagnosis not present

## 2023-01-08 DIAGNOSIS — W01198A Fall on same level from slipping, tripping and stumbling with subsequent striking against other object, initial encounter: Secondary | ICD-10-CM | POA: Insufficient documentation

## 2023-01-08 LAB — COMPREHENSIVE METABOLIC PANEL
ALT: 14 U/L (ref 0–44)
AST: 21 U/L (ref 15–41)
Albumin: 3.8 g/dL (ref 3.5–5.0)
Alkaline Phosphatase: 110 U/L (ref 38–126)
Anion gap: 11 (ref 5–15)
BUN: 24 mg/dL — ABNORMAL HIGH (ref 8–23)
CO2: 24 mmol/L (ref 22–32)
Calcium: 9.3 mg/dL (ref 8.9–10.3)
Chloride: 99 mmol/L (ref 98–111)
Creatinine, Ser: 1.08 mg/dL (ref 0.61–1.24)
GFR, Estimated: >60 mL/min (ref >60)
Glucose, Bld: 122 mg/dL — ABNORMAL HIGH (ref 70–99)
Potassium: 4.6 mmol/L (ref 3.5–5.1)
Sodium: 134 mmol/L — ABNORMAL LOW (ref 135–145)
Total Bilirubin: 0.7 mg/dL (ref <1.2)
Total Protein: 8.1 g/dL (ref 6.5–8.1)

## 2023-01-08 LAB — MAGNESIUM: Magnesium: 2.4 mg/dL (ref 1.7–2.4)

## 2023-01-08 LAB — CBC
HCT: 44.5 % (ref 39.0–52.0)
Hemoglobin: 14.7 g/dL (ref 13.0–17.0)
MCH: 28.9 pg (ref 26.0–34.0)
MCHC: 33 g/dL (ref 30.0–36.0)
MCV: 87.6 fL (ref 80.0–100.0)
Platelets: 306 10*3/uL (ref 150–400)
RBC: 5.08 MIL/uL (ref 4.22–5.81)
RDW: 12.5 % (ref 11.5–15.5)
WBC: 7.9 10*3/uL (ref 4.0–10.5)
nRBC: 0 % (ref 0.0–0.2)

## 2023-01-08 NOTE — ED Triage Notes (Signed)
Pt here after losing his balance in the bathroom and falling \\backwards  hitting his head on the wall. Hematoma noted to posterior head. Denies LOC, denies blood thinners.

## 2023-01-08 NOTE — ED Notes (Signed)
Provided pt with a drink no  other needs at present

## 2023-01-08 NOTE — ED Triage Notes (Signed)
Pt in via EMS from home with c/o fall witnessed, no LOC. Pt did hit head. Pt is baseline and family wants him checked out.  125/70, HR 60, 97% RA, CBG 161

## 2023-01-08 NOTE — ED Provider Notes (Signed)
[facility] Provider Note    Event Date/Time   First MD Initiated Contact with Patient 01/08/23 2043     (approximate)   History   Chief Complaint Fall   HPI  Christopher Fields is a 88 y.o. male with past medical history of hypertension, diabetes, CAD, and GERD who presents to the ED complaining of fall.  Family reports that patient had a witnessed fall at home where he is lost his balance and fell backwards, striking his head.  He did not lose consciousness, family does note an area of swelling to the back of his head.  He has been acting normally since then with no nausea or vomiting, does not take any blood thinners.  On my assessment, patient denies any complaints and is asking when he can go home.     Physical Exam   Triage Vital Signs: ED Triage Vitals [01/08/23 1734]  Encounter Vitals Group     BP 125/72     Systolic BP Percentile      Diastolic BP Percentile      Pulse Rate 79     Resp 18     Temp 98.1 F (36.7 C)     Temp src      SpO2 95 %     Weight      Height      Head Circumference      Peak Flow      Pain Score 3     Pain Loc      Pain Education      Exclude from Growth Chart     Most recent vital signs: Vitals:   01/08/23 1734 01/08/23 2138  BP: 125/72 (!) 114/59  Pulse: 79 66  Resp: 18 17  Temp: 98.1 F (36.7 C) 98.4 F (36.9 C)  SpO2: 95% 98%    Constitutional: Awake and alert. Eyes: Conjunctivae are normal. Head: Atraumatic. Nose: No congestion/rhinnorhea. Mouth/Throat: Mucous membranes are moist.  Neck: No midline cervical spine tenderness to palpation. Cardiovascular: Normal rate, regular rhythm. Grossly normal heart sounds.  2+ radial pulses bilaterally. Respiratory: Normal respiratory effort.  No retractions. Lungs CTAB.  No chest wall tenderness to palpation. Gastrointestinal: Soft and nontender. No distention. Musculoskeletal: No lower extremity tenderness nor edema.  No upper extremity bony tenderness  to palpation. Neurologic:  Normal speech and language. No gross focal neurologic deficits are appreciated.    ED Results / Procedures / Treatments   Labs (all labs ordered are listed, but only abnormal results are displayed) Labs Reviewed  COMPREHENSIVE METABOLIC PANEL - Abnormal; Notable for the following components:      Result Value   Sodium 134 (*)    Glucose, Bld 122 (*)    BUN 24 (*)    All other components within normal limits  CBC  MAGNESIUM     EKG  ED ECG REPORT I, Chesley Noon, the attending physician, personally viewed and interpreted this ECG.   Date: 01/08/2023  EKG Time: 21:36  Rate: 67  Rhythm: normal sinus rhythm  Axis: Normal  Intervals:first-degree A-V block   ST&T Change: None  RADIOLOGY CT head reviewed and interpreted by me with no hemorrhage or midline shift.  PROCEDURES:  Critical Care performed: No  Procedures   MEDICATIONS ORDERED IN ED: Medications - No data to display   IMPRESSION / MDM / ASSESSMENT AND PLAN / ED COURSE  I reviewed the triage vital signs and the nursing notes.  88 y.o. male with past medical history of hypertension, diabetes, CAD, and GERD who presents to the ED complaining of fall at home when he lost his balance and fell backwards, striking his head.  Patient's presentation is most consistent with acute presentation with potential threat to life or bodily function.  Differential diagnosis includes, but is not limited to, intracranial injury, cervical spine injury, anemia, electrolyte abnormality, AKI, arrhythmia.  Patient nontoxic-appearing and in no acute distress, vital signs are unremarkable.  He is oriented to his baseline, denies any complaints on my assessment.  CT head and cervical spine are negative for acute process.  Labs are reassuring with no significant anemia, electrolyte abnormality, or AKI.  LFTs and magnesium level are also unremarkable, EKG shows no evidence of  arrhythmia or ischemia.  Patient appropriate for discharge home with PCP follow-up, patient and family counseled to return to the ED for new or worsening symptoms.  Family agrees with plan.      FINAL CLINICAL IMPRESSION(S) / ED DIAGNOSES   Final diagnoses:  Fall, initial encounter  Injury of head, initial encounter     Rx / DC Orders   ED Discharge Orders     None        Note:  This document was prepared using Dragon voice recognition software and may include unintentional dictation errors.   Chesley Noon, MD 01/08/23 2204

## 2023-02-05 ENCOUNTER — Other Ambulatory Visit: Payer: Self-pay

## 2023-02-05 ENCOUNTER — Emergency Department: Payer: Medicare Other

## 2023-02-05 ENCOUNTER — Inpatient Hospital Stay
Admission: EM | Admit: 2023-02-05 | Discharge: 2023-02-08 | DRG: 637 | Disposition: A | Discharge status: Home Health | Payer: Medicare Other | Attending: Hospitalist | Admitting: Hospitalist

## 2023-02-05 DIAGNOSIS — N390 Urinary tract infection, site not specified: Secondary | ICD-10-CM | POA: Diagnosis not present

## 2023-02-05 DIAGNOSIS — Z681 Body mass index (BMI) 19 or less, adult: Secondary | ICD-10-CM | POA: Diagnosis not present

## 2023-02-05 DIAGNOSIS — Z66 Do not resuscitate: Secondary | ICD-10-CM | POA: Diagnosis present

## 2023-02-05 DIAGNOSIS — G9341 Metabolic encephalopathy: Secondary | ICD-10-CM | POA: Diagnosis present

## 2023-02-05 DIAGNOSIS — Z955 Presence of coronary angioplasty implant and graft: Secondary | ICD-10-CM

## 2023-02-05 DIAGNOSIS — G4730 Sleep apnea, unspecified: Secondary | ICD-10-CM | POA: Diagnosis present

## 2023-02-05 DIAGNOSIS — E871 Hypo-osmolality and hyponatremia: Secondary | ICD-10-CM | POA: Diagnosis present

## 2023-02-05 DIAGNOSIS — I2510 Atherosclerotic heart disease of native coronary artery without angina pectoris: Secondary | ICD-10-CM | POA: Diagnosis present

## 2023-02-05 DIAGNOSIS — I10 Essential (primary) hypertension: Secondary | ICD-10-CM | POA: Diagnosis present

## 2023-02-05 DIAGNOSIS — Z1152 Encounter for screening for COVID-19: Secondary | ICD-10-CM | POA: Diagnosis not present

## 2023-02-05 DIAGNOSIS — K219 Gastro-esophageal reflux disease without esophagitis: Secondary | ICD-10-CM | POA: Diagnosis present

## 2023-02-05 DIAGNOSIS — R4182 Altered mental status, unspecified: Secondary | ICD-10-CM | POA: Diagnosis present

## 2023-02-05 DIAGNOSIS — F0390 Unspecified dementia without behavioral disturbance: Secondary | ICD-10-CM | POA: Diagnosis present

## 2023-02-05 DIAGNOSIS — H9190 Unspecified hearing loss, unspecified ear: Secondary | ICD-10-CM | POA: Diagnosis present

## 2023-02-05 DIAGNOSIS — E162 Hypoglycemia, unspecified: Secondary | ICD-10-CM | POA: Diagnosis present

## 2023-02-05 DIAGNOSIS — Z79899 Other long term (current) drug therapy: Secondary | ICD-10-CM | POA: Diagnosis not present

## 2023-02-05 DIAGNOSIS — R636 Underweight: Secondary | ICD-10-CM | POA: Diagnosis present

## 2023-02-05 DIAGNOSIS — G9340 Encephalopathy, unspecified: Secondary | ICD-10-CM | POA: Diagnosis not present

## 2023-02-05 DIAGNOSIS — Z800 Family history of malignant neoplasm of digestive organs: Secondary | ICD-10-CM | POA: Diagnosis not present

## 2023-02-05 DIAGNOSIS — Z8711 Personal history of peptic ulcer disease: Secondary | ICD-10-CM | POA: Diagnosis not present

## 2023-02-05 DIAGNOSIS — Z87891 Personal history of nicotine dependence: Secondary | ICD-10-CM

## 2023-02-05 DIAGNOSIS — Z880 Allergy status to penicillin: Secondary | ICD-10-CM | POA: Diagnosis not present

## 2023-02-05 DIAGNOSIS — Z7982 Long term (current) use of aspirin: Secondary | ICD-10-CM | POA: Diagnosis not present

## 2023-02-05 DIAGNOSIS — E11649 Type 2 diabetes mellitus with hypoglycemia without coma: Principal | ICD-10-CM | POA: Diagnosis present

## 2023-02-05 DIAGNOSIS — Z7984 Long term (current) use of oral hypoglycemic drugs: Secondary | ICD-10-CM | POA: Diagnosis not present

## 2023-02-05 LAB — COMPREHENSIVE METABOLIC PANEL
ALT: 13 U/L (ref 0–44)
AST: 25 U/L (ref 15–41)
Albumin: 3.4 g/dL — ABNORMAL LOW (ref 3.5–5.0)
Alkaline Phosphatase: 91 U/L (ref 38–126)
Anion gap: 12 (ref 5–15)
BUN: 24 mg/dL — ABNORMAL HIGH (ref 8–23)
CO2: 21 mmol/L — ABNORMAL LOW (ref 22–32)
Calcium: 8.7 mg/dL — ABNORMAL LOW (ref 8.9–10.3)
Chloride: 97 mmol/L — ABNORMAL LOW (ref 98–111)
Creatinine, Ser: 1.06 mg/dL (ref 0.61–1.24)
GFR, Estimated: >60 mL/min (ref >60)
Glucose, Bld: 196 mg/dL — ABNORMAL HIGH (ref 70–99)
Potassium: 4.1 mmol/L (ref 3.5–5.1)
Sodium: 130 mmol/L — ABNORMAL LOW (ref 135–145)
Total Bilirubin: 0.7 mg/dL (ref 0.0–1.2)
Total Protein: 7.2 g/dL (ref 6.5–8.1)

## 2023-02-05 LAB — RESP PANEL BY RT-PCR (RSV, FLU A&B, COVID)  RVPGX2
Influenza A by PCR: NEGATIVE
Influenza B by PCR: NEGATIVE
Resp Syncytial Virus by PCR: NEGATIVE
SARS Coronavirus 2 by RT PCR: NEGATIVE

## 2023-02-05 LAB — CBG MONITORING, ED
Glucose-Capillary: 91 mg/dL (ref 70–99)
Glucose-Capillary: 156 mg/dL — ABNORMAL HIGH (ref 70–99)
Glucose-Capillary: 45 mg/dL — ABNORMAL LOW (ref 70–99)
Glucose-Capillary: 113 mg/dL — ABNORMAL HIGH (ref 70–99)
Glucose-Capillary: 87 mg/dL (ref 70–99)
Glucose-Capillary: 136 mg/dL — ABNORMAL HIGH (ref 70–99)
Glucose-Capillary: 127 mg/dL — ABNORMAL HIGH (ref 70–99)
Glucose-Capillary: 111 mg/dL — ABNORMAL HIGH (ref 70–99)
Glucose-Capillary: 111 mg/dL — ABNORMAL HIGH (ref 70–99)
Glucose-Capillary: 117 mg/dL — ABNORMAL HIGH (ref 70–99)
Glucose-Capillary: 119 mg/dL — ABNORMAL HIGH (ref 70–99)
Glucose-Capillary: 122 mg/dL — ABNORMAL HIGH (ref 70–99)

## 2023-02-05 LAB — CBC
HCT: 43.5 % (ref 39.0–52.0)
Hemoglobin: 13.9 g/dL (ref 13.0–17.0)
MCH: 29 pg (ref 26.0–34.0)
MCHC: 32 g/dL (ref 30.0–36.0)
MCV: 90.6 fL (ref 80.0–100.0)
Platelets: 310 10*3/uL (ref 150–400)
RBC: 4.8 MIL/uL (ref 4.22–5.81)
RDW: 12.6 % (ref 11.5–15.5)
WBC: 7.9 10*3/uL (ref 4.0–10.5)
nRBC: 0 % (ref 0.0–0.2)

## 2023-02-05 LAB — URINALYSIS, COMPLETE (UACMP) WITH MICROSCOPIC
Bacteria, UA: NONE SEEN
Bilirubin Urine: NEGATIVE
Glucose, UA: NEGATIVE mg/dL
Ketones, ur: NEGATIVE mg/dL
Nitrite: NEGATIVE
Protein, ur: 30 mg/dL — AB
Specific Gravity, Urine: 1.021 (ref 1.005–1.030)
pH: 6 (ref 5.0–8.0)

## 2023-02-05 LAB — TROPONIN I (HIGH SENSITIVITY)
Troponin I (High Sensitivity): 5 ng/L (ref <18)
Troponin I (High Sensitivity): 7 ng/L (ref <18)

## 2023-02-05 MED ORDER — ENOXAPARIN SODIUM 40 MG/0.4ML IJ SOSY
40.0000 mg | PREFILLED_SYRINGE | INTRAMUSCULAR | Status: DC
  Administered 2023-02-05 – 2023-02-07 (×3): 40 mg via SUBCUTANEOUS
  Filled 2023-02-05 (×3): qty 0.4

## 2023-02-05 MED ORDER — DEXTROSE-SODIUM CHLORIDE 5-0.45 % IV SOLN
INTRAVENOUS | Status: AC
Start: 2023-02-05 — End: 2023-02-06

## 2023-02-05 MED ORDER — DEXTROSE 50 % IV SOLN
25.0000 g | Freq: Once | INTRAVENOUS | Status: AC
  Administered 2023-02-05: 25 g via INTRAVENOUS
  Filled 2023-02-05: qty 50

## 2023-02-05 MED ORDER — ONDANSETRON HCL 4 MG/2ML IJ SOLN: 4.0000 mg | Freq: Four times a day (QID) | INTRAMUSCULAR | Status: DC | PRN

## 2023-02-05 MED ORDER — SODIUM CHLORIDE 0.9 % IV SOLN
1.0000 g | INTRAVENOUS | Status: DC
  Administered 2023-02-05 – 2023-02-07 (×3): 1 g via INTRAVENOUS
  Filled 2023-02-05 (×3): qty 10

## 2023-02-05 MED ORDER — ONDANSETRON HCL 4 MG PO TABS: 4.0000 mg | ORAL_TABLET | Freq: Four times a day (QID) | ORAL | Status: DC | PRN

## 2023-02-05 NOTE — ED Notes (Signed)
Meal tray taken away, NPO.

## 2023-02-05 NOTE — ED Notes (Signed)
Pt soiled underwear and pants. This RN cleaned Pt up and placed on condom catheter. Pt placed on bed alarm and fall sign placed outside. CB in reach, door left open.

## 2023-02-05 NOTE — ED Notes (Signed)
Pt bed alarm going off, this RN and another RN rushed to room to see what Pt need. He was confused but easily re-oriented. Pt helped back to bed, CB in reach. Safety measures applied.

## 2023-02-05 NOTE — ED Notes (Signed)
This RN went to check on Pt and draw blood and notice Pt was altered and mumbling his words. This RN checked CBG and it was 45. EDP notifed, and ordered 1 amp of dextrose. Also notified unable to urinate, requested in and out cath for Pt, EDP ordered. Will Recheck CBG in 15 mins.

## 2023-02-05 NOTE — ED Notes (Signed)
Pt sleeping comfortably in bed. No resp distress.

## 2023-02-05 NOTE — H&P (Signed)
History and Physical    Patient: Christopher Fields:[id number] DOB: 17-Jan-1935 DOA: 02/05/2023 DOS: the patient was seen and examined on 02/05/2023 PCP: Gareth Morgan, MD  Patient coming from: Home  Chief Complaint:  Chief Complaint  Patient presents with   Altered Mental Status   HPI: Christopher Fields is a 89 y.o. male with medical history significant of CAD, GERD, hypertension, and encephalopathy.  Per report, patient worsening confusion since today.  Family does report overall worsening confusion over multiple days to weeks at baseline.  No fevers or chills.  Family reports overall decreased p.o. intake.  Last blood sugar check yesterday was with blood sugar in the 130s.  Patient has refused food.  No reports of chest pain or shortness of breath.  Minimal cough.  Positive recurrent increased urinary frequency per the family.  Family is concerned about overall functional status.  Is considering nursing home for the patient.  EMS called with patient noted to have blood sugar in the 50s. Presented to the ER afebrile, hemodynamically stable.  Satting well on room air.  White count 7.9, hemoglobin 13.9, platelets 310, urinalysis indicative of infection.  Blood sugars ranging in the 40s to 90s.  Chest x-ray and CT head grossly within normal limits. Review of Systems: As mentioned in the history of present illness. All other systems reviewed and are negative. Past Medical History:  Diagnosis Date   CAD (coronary artery disease)    Diabetes mellitus    GERD (gastroesophageal reflux disease)    Hypertension    Sleep apnea    Has never used his machine   Past Surgical History:  Procedure Laterality Date   CARPAL TUNNEL RELEASE Right 02/24/2016   Procedure: RIGHT CARPAL TUNNEL RELEASE;  Surgeon: Cindee Salt, MD;  Location: [facility];  Service: Orthopedics;  Laterality: Right;   COLONOSCOPY  07/01/06   normal colon colonic mucosa appeared normal   CORONARY ANGIOPLASTY WITH  STENT PLACEMENT  2007   ESOPHAGEAL DILATION N/A 08/25/2018   Procedure: ESOPHAGEAL DILATION;  Surgeon: [name], MD;  [facility];  Service: Endoscopy;  Laterality: N/A;   ESOPHAGOGASTRODUODENOSCOPY  01/21/2011   RMR: Hiatal hernia. 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 Gastric erosions-status post biopsy (no h.pyori)   ESOPHAGOGASTRODUODENOSCOPY N/A 08/25/2018   Procedure: ESOPHAGOGASTRODUODENOSCOPY (EGD);  Surgeon: [name], MD;  Location: [facility];  Service: Endoscopy;  Laterality: N/A;   ESOPHAGOGASTRODUODENOSCOPY (EGD) WITH ESOPHAGEAL DILATION N/A 05/02/2013   Dr. Jena Gauss: gastritis, probable cervical esophageal web   Social History:  reports that he quit smoking about 73 years ago. His smoking use included cigarettes. He started smoking about 76 years ago. He has a 1.5 pack-year smoking history. He has never used smokeless tobacco. He reports that he does not drink alcohol and does not use drugs.  Allergies  Allergen Reactions   Penicillins Itching and Rash    Family History  Problem Relation Age of Onset   Colon cancer Sister        deceased age 38    Prior to Admission medications   Medication Sig Start Date End Date Taking? Authorizing Provider  aspirin 81 MG tablet Take 81 mg by mouth daily.      [provider]  gabapentin (NEURONTIN) 100 MG capsule Take 1 capsule (100 mg total) by mouth 3 (three) times daily as needed (neuropathy pain). 08/27/18   Johnson, Clanford L, MD  glipiZIDE (GLUCOTROL) 5 MG tablet Take 1 tablet (5 mg total)  by mouth daily before breakfast. 08/27/18   Laural Benes, Clanford L, MD  metFORMIN (GLUMETZA) 500 MG (MOD) 24 hr tablet Take 1-2 tablets (500-1,000 mg total) by mouth See admin instructions. Take 1000mg  every morning, 500mg  daily at noon, and 500mg  every evening. 08/27/18   Johnson, Clanford L, MD  pantoprazole (PROTONIX) 40 MG tablet Take 1 tablet (40 mg total) by mouth daily. 08/27/18   Johnson, Clanford L,  MD  potassium chloride SA (K-DUR) 20 MEQ tablet Take 1 tablet (20 mEq total) by mouth daily. 08/27/18   Cleora Fleet, MD    Physical Exam: Vitals:   02/05/23 1300 02/05/23 1315 02/05/23 1330 02/05/23 1331  BP: (!) 126/58  (!) 133/51   Pulse: 62 (!) 56 (!) 55   Resp: (!) 21 (!) 22 16   Temp:    98 F (36.7 C)  TempSrc:    Oral  SpO2: 98% 98% 99%   Weight:      Height:       Physical Exam Constitutional:      Comments: Underweight  Disshevled appearing  + generalized confusion    HENT:     Head: Normocephalic.     Mouth/Throat:     Mouth: Mucous membranes are dry.  Eyes:     Pupils: Pupils are equal, round, and reactive to light.  Cardiovascular:     Rate and Rhythm: Normal rate and regular rhythm.  Pulmonary:     Effort: Pulmonary effort is normal.  Abdominal:     General: Bowel sounds are normal.  Musculoskeletal:        General: Normal range of motion.  Skin:    General: Skin is dry.  Neurological:     General: No focal deficit present.  Psychiatric:        Mood and Affect: Mood normal.     Data Reviewed:  There are no new results to review at this time.  DG Chest Portable 1 View CLINICAL DATA:  Altered mental status.  EXAM: PORTABLE CHEST 1 VIEW  COMPARISON:  02/07/2018  FINDINGS: Heart size and mediastinal contours are unremarkable. Lung volumes are low. Linear area of scar identified within the left lower lung. No pleural effusion, airspace consolidation or pneumothorax.  IMPRESSION: Low lung volumes. No acute findings.  Electronically Signed   By: Signa Kell M.D.   On: 02/05/2023 13:17 CT HEAD WO CONTRAST ( ) CLINICAL DATA:  Mental status change with unknown cause.  EXAM: CT HEAD WITHOUT CONTRAST  TECHNIQUE: Contiguous axial images were obtained from the base of the skull through the vertex without intravenous contrast.  RADIATION DOSE REDUCTION: This exam was performed according to the departmental dose-optimization  program which includes automated exposure control, adjustment of the mA and/or kV according to patient size and/or use of iterative reconstruction technique.  COMPARISON:  01/08/2023  FINDINGS: Brain: Brain atrophy which is advanced in the anterior temporal lobes. Confluent chronic small vessel ischemia in the deep cerebral white matter. No evidence of acute infarct, hemorrhage, hydrocephalus, mass, or collection.  Vascular: No hyperdense vessel or unexpected calcification.  Skull: Normal. Negative for fracture or focal lesion.  Sinuses/Orbits: No acute finding.  IMPRESSION: 1. No acute or reversible finding. 2. Atrophy that is advanced in the anterior temporal lobes.  Electronically Signed   By: Tiburcio Pea M.D.   On: 02/05/2023 10:22  Lab Results  Component Value Date   WBC 7.9 02/05/2023   HGB 13.9 02/05/2023   HCT 43.5 02/05/2023   MCV 90.6 02/05/2023  PLT 310 02/05/2023   Last metabolic panel Lab Results  Component Value Date   GLUCOSE 196 (H) 02/05/2023   NA 130 (L) 02/05/2023   K 4.1 02/05/2023   CL 97 (L) 02/05/2023   CO2 21 (L) 02/05/2023   BUN 24 (H) 02/05/2023   CREATININE 1.06 02/05/2023   GFRNONAA >60 02/05/2023   CALCIUM 8.7 (L) 02/05/2023   PROT 7.2 02/05/2023   ALBUMIN 3.4 (L) 02/05/2023   BILITOT 0.7 02/05/2023   ALKPHOS 91 02/05/2023   AST 25 02/05/2023   ALT 13 02/05/2023   ANIONGAP 12 02/05/2023    Assessment and Plan: Encephalopathy Hypoglycemia  UTI  + generalized confusion x 12-24 hours  Likely multifactorial w/ contributions of dementia, symptomatic hypoglycemia and UTI  Unclear of general baseline  CT head WNL  D5 IVF  Serial blood sugar checks  IV rocephin  Check ammonia level  Monitor   GERD PPI    Essential hypertension BP stable  Titrate home regimen        Advance Care Planning:   Code Status: Limited: Do not attempt resuscitation (DNR) -DNR-LIMITED -Do Not Intubate/DNI    Consults: None   Family  Communication: Family at the bedside   Severity of Illness: The appropriate patient status for this patient is INPATIENT. Inpatient status is judged to be reasonable and necessary in order to provide the required intensity of service to ensure the patient's safety. The patient's presenting symptoms, physical exam findings, and initial radiographic and laboratory data in the context of their chronic comorbidities is felt to place them at high risk for further clinical deterioration. Furthermore, it is not anticipated that the patient will be medically stable for discharge from the hospital within 2 midnights of admission.   * I certify that at the point of admission it is my clinical judgment that the patient will require inpatient hospital care spanning beyond 2 midnights from the point of admission due to high intensity of service, high risk for further deterioration and high frequency of surveillance required.*  Author: Floydene Flock, MD 02/05/2023 2:32 PM  For on call review [email or web address].

## 2023-02-05 NOTE — Assessment & Plan Note (Signed)
BP stable Titrate home regimen 

## 2023-02-05 NOTE — Assessment & Plan Note (Addendum)
Hypoglycemia  UTI  + generalized confusion x 12-24 hours  Likely multifactorial w/ contributions of dementia, symptomatic hypoglycemia and UTI  Unclear of general baseline  CT head WNL  D5 IVF  Serial blood sugar checks  IV rocephin  Check ammonia level  Monitor

## 2023-02-05 NOTE — ED Provider Notes (Signed)
[facility] Provider Note    Event Date/Time   First MD Initiated Contact with Patient 02/05/23 [phone]     (approximate)  History   Chief Complaint: Altered Mental Status  HPI  Christopher Fields is a 89 y.o. male with a past medical history of diabetes, gastric reflux, hypertension, CAD, presents to the emergency department for altered mental status.  According to EMS patient is coming from home where family found the patient on the couch not quite acting his self not answering questions appropriately.  EMS states the patient's initial blood glucose of 62 was given D10 and upon arrival blood glucose 150.  EMS reports the patient is more alert.  Here patient is hard of hearing, but otherwise he denies any complaints.  States he feels well and denies any pain.  Awaiting family arrival for further history.  Physical Exam   Triage Vital Signs: ED Triage Vitals  Encounter Vitals Group     BP 02/05/23 0928 (!) 161/54     Systolic BP Percentile --      Diastolic BP Percentile --      Pulse Rate 02/05/23 0928 (!) 54     Resp 02/05/23 0928 18     Temp 02/05/23 0925 98.1 F (36.7 C)     Temp Source 02/05/23 0925 Oral     SpO2 02/05/23 0928 100 %     Weight 02/05/23 0925 130 lb (59 kg)     Height 02/05/23 0925 5\' 9"  (1.753 m)     Head Circumference --      Peak Flow --      Pain Score 02/05/23 0925 0     Pain Loc --      Pain Education --      Exclude from Growth Chart --     Most recent vital signs: Vitals:   02/05/23 0925 02/05/23 0928  BP:  (!) 161/54  Pulse:  (!) 54  Resp:  18  Temp: 98.1 F (36.7 C)   SpO2:  100%    General: Awake, no distress.  Hard of hearing but able to answer questions appropriately. CV:  Good peripheral perfusion.  Regular rate and rhythm  Resp:  Normal effort.  Equal breath sounds bilaterally.  Abd:  No distention.  Soft, nontender.  No rebound or guarding.  ED Results / Procedures / Treatments   EKG  EKG viewed and  interpreted by myself shows sinus bradycardia 49 bpm with a narrow QRS, normal axis, largely normal intervals with no concerning ST changes.  RADIOLOGY  I have reviewed and interpreted CT images.  No bleed seen on my evaluation. Radiology is read the CT scan as negative. Chest x-ray is negative.   MEDICATIONS ORDERED IN ED: Medications - No data to display   IMPRESSION / MDM / ASSESSMENT AND PLAN / ED COURSE  I reviewed the triage vital signs and the nursing notes.  Patient's presentation is most consistent with acute presentation with potential threat to life or bodily function.  Patient presents to the emergency department for altered mental status EMS called by family for patient not answering questions appropriately and seeming confused.  Patient was given D10 and route to the hospital blood sugar increased from 62-150.  Here the patient is somewhat somnolent but will awaken to voice, is hard of hearing but will answer most questions, denies any pain states he feels well.  However given the altered mental complaint we will check labs, urinalysis, CT scan head  continue to closely monitor.  Patient's labs show an overall reassuring chemistry blood sugar had increased to 196, CBC is reassuring, normal white blood cell count, troponin is negative, urinalysis does show small amount of white and red cells but no bacteria.  Patient's COVID/flu/RSV is negative.  Chest x-ray is clear and CT scan head shows no acute finding.  Patient blood sugar shortly after lab work to drop back down to 45 and the patient became very somnolent.  Patient was given dextrose and once again the blood sugar has increased.  Given the patient's fluctuating hypoglycemia with continued altered mental status we will admit to the hospitalist service for further workup and treatment.  FINAL CLINICAL IMPRESSION(S) / ED DIAGNOSES   Altered mental status Hypoglycemia   Note:  This document was prepared using Dragon voice  recognition software and may include unintentional dictation errors.   Minna Antis, MD 02/05/23 1358

## 2023-02-05 NOTE — Assessment & Plan Note (Signed)
PPI ?

## 2023-02-05 NOTE — ED Triage Notes (Signed)
Pt BIB [facility] for AMS, per family found on couch not quite his self, not answering questions like normal. EMS CBG at 62, given 10g D10, CBG at 150, EMS states pt more alert. EMS states per family his sugar usually runs high. Pt is hard of hearing with bilateral hearing aids. Pt also hard to understand family states this is normal. Per EMS pt is at baseline compared to families description. Vitals BP 163/67, 62 HR, 98% RA

## 2023-02-05 NOTE — ED Notes (Signed)
This RN noticed Pt was coughing when Pt family was feeding him. This RN asked family to stop and this RN did a swallow screen on him. Pt failed and is NPO.

## 2023-02-05 NOTE — ED Notes (Signed)
Pt very hard of hearing, having difficulty getting pt to hear this RN ask questions.

## 2023-02-06 DIAGNOSIS — E162 Hypoglycemia, unspecified: Secondary | ICD-10-CM

## 2023-02-06 LAB — COMPREHENSIVE METABOLIC PANEL
ALT: 10 U/L (ref 0–44)
AST: 19 U/L (ref 15–41)
Albumin: 2.8 g/dL — ABNORMAL LOW (ref 3.5–5.0)
Alkaline Phosphatase: 78 U/L (ref 38–126)
Anion gap: 10 (ref 5–15)
BUN: 15 mg/dL (ref 8–23)
CO2: 23 mmol/L (ref 22–32)
Calcium: 8.4 mg/dL — ABNORMAL LOW (ref 8.9–10.3)
Chloride: 100 mmol/L (ref 98–111)
Creatinine, Ser: 0.87 mg/dL (ref 0.61–1.24)
GFR, Estimated: >60 mL/min (ref >60)
Glucose, Bld: 135 mg/dL — ABNORMAL HIGH (ref 70–99)
Potassium: 3.7 mmol/L (ref 3.5–5.1)
Sodium: 133 mmol/L — ABNORMAL LOW (ref 135–145)
Total Bilirubin: 0.5 mg/dL (ref 0.0–1.2)
Total Protein: 6.5 g/dL (ref 6.5–8.1)

## 2023-02-06 LAB — CBC
HCT: 38.3 % — ABNORMAL LOW (ref 39.0–52.0)
Hemoglobin: 12.8 g/dL — ABNORMAL LOW (ref 13.0–17.0)
MCH: 28.4 pg (ref 26.0–34.0)
MCHC: 33.4 g/dL (ref 30.0–36.0)
MCV: 84.9 fL (ref 80.0–100.0)
Platelets: 285 10*3/uL (ref 150–400)
RBC: 4.51 MIL/uL (ref 4.22–5.81)
RDW: 12.6 % (ref 11.5–15.5)
WBC: 5 10*3/uL (ref 4.0–10.5)
nRBC: 0 % (ref 0.0–0.2)

## 2023-02-06 LAB — HEMOGLOBIN A1C
Hgb A1c MFr Bld: 6.2 % — ABNORMAL HIGH (ref 4.8–5.6)
Mean Plasma Glucose: 131.24 mg/dL

## 2023-02-06 LAB — CBG MONITORING, ED
Glucose-Capillary: 100 mg/dL — ABNORMAL HIGH (ref 70–99)
Glucose-Capillary: 129 mg/dL — ABNORMAL HIGH (ref 70–99)
Glucose-Capillary: 138 mg/dL — ABNORMAL HIGH (ref 70–99)
Glucose-Capillary: 138 mg/dL — ABNORMAL HIGH (ref 70–99)

## 2023-02-06 MED ORDER — PANTOPRAZOLE SODIUM 40 MG PO TBEC
40.0000 mg | DELAYED_RELEASE_TABLET | Freq: Every day | ORAL | Status: DC
  Administered 2023-02-07 – 2023-02-08 (×2): 40 mg via ORAL
  Filled 2023-02-06 (×2): qty 1

## 2023-02-06 MED ORDER — CITALOPRAM HYDROBROMIDE 20 MG PO TABS
20.0000 mg | ORAL_TABLET | Freq: Every day | ORAL | Status: DC
  Administered 2023-02-07 – 2023-02-08 (×2): 20 mg via ORAL
  Filled 2023-02-06 (×2): qty 1

## 2023-02-06 MED ORDER — ASPIRIN 81 MG PO CHEW
81.0000 mg | CHEWABLE_TABLET | Freq: Every day | ORAL | Status: DC
  Administered 2023-02-07 – 2023-02-08 (×2): 81 mg via ORAL
  Filled 2023-02-06 (×2): qty 1

## 2023-02-06 NOTE — ED Notes (Signed)
Pt attempting to get out of bed to urinate. Helped by Morrie Sheldon RN, pt able to void, sheet changed new chuck placed.

## 2023-02-06 NOTE — TOC Initial Note (Signed)
Transition of Care [facility]) - Initial/Assessment Note    Patient Details  Name: Christopher Fields MRN: [id number] Date of Birth: July 23, 1934  [facility]) CM/SW Contact:    Maree Krabbe, LCSW Phone Number: 02/06/2023, 11:52 AM  Clinical Narrative:              SW spoke with daughter Jasmine December after multiple attempts to reach daughter Okey Regal. Pt lives with Daughter Okey Regal and several other family mbr's in the house. Jasmine December is unsure if Okey Regal wants pt to go to SNF or discharge back home. Jasmine December is going to also try to reach out to [facility]. SW unable to leave Okey Regal a [facility] as it is full. SW completed fl2 in the case for SNF is determine for choice. SW will continue to reach out to [facility].     Expected Discharge Plan:  (SNF VS HOME) Barriers to Discharge: Continued Medical Work up   Patient Goals and CMS Choice            Expected Discharge Plan and Services In-house Referral: Clinical Social Work     Living arrangements for the past 2 months: Single Family Home                                      Prior Living Arrangements/Services Living arrangements for the past 2 months: Single Family Home Lives with:: Adult Children Patient language and need for interpreter reviewed:: No Do you feel safe going back to the place where you live?: Yes      Need for Family Participation in Patient Care: Yes (Comment) Care giver support system in place?: Yes (comment)   Criminal Activity/Legal Involvement Pertinent to Current Situation/Hospitalization: No - Comment as needed  Activities of Daily Living      Permission Sought/Granted Permission sought to share information with : Family Supports Permission granted to share information with : Yes, Verbal Permission Granted  Share Information with NAME: Hilarie Fredrickson     Permission granted to share info w Relationship: daughters     Emotional Assessment Appearance:: Appears stated age Attitude/Demeanor/Rapport: Unable to  Assess Affect (typically observed): Unable to Assess Orientation: : Oriented to Self Alcohol / Substance Use: Not Applicable Psych Involvement: No (comment)  Admission diagnosis:  Hypoglycemia [E16.2] Patient Active Problem List   Diagnosis Date Noted   Hypoglycemia 02/05/2023   Encephalopathy 02/05/2023   Hypokalemia 08/24/2018   Abnormal LFTs 08/24/2018   Hyponatremia 08/24/2018   Dehydration    Vomiting    Esophageal dysphagia 04/17/2013   History of colonic polyps 04/17/2013   Dysphagia 01/01/2011   FH: colon cancer 01/01/2011   ALLERGIC RHINITIS 02/06/2009   Type 2 diabetes mellitus (HCC) 12/07/2007   DECREASED HEARING 09/21/2007   BENIGN PROSTATIC HYPERTROPHY, WITH OBSTRUCTION 07/15/2006   Hyperlipidemia 12/28/2005   Essential hypertension 12/28/2005   Coronary atherosclerosis 12/28/2005   GERD 12/28/2005   PCP:  System, Provider Not In Pharmacy:   [facility]  [address] [place] [address] Phone: [phone] Fax: [phone]     Social Drivers of Health (SDOH) Social History: SDOH Screenings   Tobacco Use: Medium Risk (02/05/2023)   SDOH Interventions:     Readmission Risk Interventions     No data to display

## 2023-02-06 NOTE — Evaluation (Signed)
Physical Therapy Evaluation Patient Details Name: Christopher Fields MRN: [id number] DOB: [birth year] Today's Date: 02/06/2023  History of Present Illness  Pt admitted to [facility] on 02/05/23 for c/o AMS and hypoglycemia. Encephalopathy likely multifactorial with contributions of dementia, symptomatic hypoglycemia and UTI. Significant PMH includes: DM, GERD, HTN, CAD. Imaging negative for acute abnormality.   Clinical Impression  Pt is a 89 year old M admitted to hospital on 02/05/23 for hypoglycemia. Unable to obtain subjective/PLOF information due to [facility] vs. AMS. PLOF obtained from DIL, Okey Regal, via phone. Over 2 weeks prior to admission, pt was unable to get off couch, was using toileting pads for voiding, and was intermittently refusing to eat. Prior to this, pt was independent with ambulation without AD, and ADL's (sponge baths). Family assists with transportation, IADL's, and medication management. DIL endorses increased difficulty caring for the pt as she has limited assistance at home and is an amputee.  Pt presents with impaired processing, difficulty following commands, AMS, HOH, decreased gross balance, decreased activity tolerance, impaired safety awareness, resulting in impaired functional mobility from baseline. Due to deficits, pt required supervision for bed mobility, CGA for safety with transfers in RW, and CGA to ambulate 36ft x2 from EOB<>bathroom for toileting. Increased unsteadiness noted with gait and 180deg turns which increases the pt's risk of falls without attended mobility.  Deficits limit the pt's ability to safely and independently perform ADL's, transfer, and ambulate. Pt will benefit from acute skilled PT services to address deficits for return to baseline function. Pt will benefit from post acute therapy services.         If plan is discharge home, recommend the following: A little help with walking and/or transfers;A little help with bathing/dressing/bathroom;Assistance with  cooking/housework;Direct supervision/assist for medications management;Assist for transportation;Help with stairs or ramp for entrance;Supervision due to cognitive status   Can travel by private vehicle   Yes    Equipment Recommendations None recommended by PT (will likely need RW upon return home)     Functional Status Assessment Patient has had a recent decline in their functional status and demonstrates the ability to make significant improvements in function in a reasonable and predictable amount of time.     Precautions / Restrictions Precautions Precautions: Fall Restrictions Other Position/Activity Restrictions: NPO      Mobility  Bed Mobility Overal bed mobility: Needs Assistance Bed Mobility: Supine to Sit, Sit to Supine     Supine to sit: Supervision Sit to supine: Supervision   General bed mobility comments: Supervision for safety to perform supine<>Sit transfer to exit bed to the RIGHT. Increased reliance of UE support to facilitate transfers.    Transfers Overall transfer level: Needs assistance Equipment used: Rolling walker (2 wheels) Transfers: Sit to/from Stand Sit to Stand: Contact guard assist           General transfer comment: CGA for safety to stand from EOB and to sit EOB with RW. Demonstrates poor safety awareness with BUE hand placement on RW despite verbal cues. Demonstrates "fair" eccentric lowering with stand>sit.    Ambulation/Gait Ambulation/Gait assistance: Contact guard assist Gait Distance (Feet): 40 Feet Assistive device: Rolling walker (2 wheels)         General Gait Details: CGA for safety to ambulate to/from bathroom with RW. Demonstrates decreased RW proximity, decreased step length/foot clearance bilaterally, increased veering, slowed cadence, and increased unsteadiness with 180deg turns. Verbal cues for safety, sequencing, obstacle negotiation, and RW proximity.      Balance Overall balance assessment: Needs  assistance    Sitting balance-Leahy Scale: Good Sitting balance - Comments: seated balance EOB, BUE support, no BLE support, no LOB   Standing balance support: Bilateral upper extremity supported, No upper extremity supported, During functional activity Standing balance-Leahy Scale: Fair Standing balance comment: Fair standing balance with BUE support on RW during gait. Fair static standing balance while voiding at toilet without UE support, increased sway noted.                             Pertinent Vitals/Pain Pain Assessment Pain Assessment: No/denies pain    Home Living Family/patient expects to be discharged to:: Private residence (lives at son/DIL's house) Living Arrangements: Spouse/significant other;Children (lives with son, DIL, grandson, and grandsons girlfriend) Available Help at Discharge: Family (DIL is amputee and available for 24/7 supervision. Son and grandson work and able to provide PRN care (work during the day, long hours). Grandson's girlfriend available does not work, questionable amount of assist) Type of Home: House Home Access: Level entry       Home Layout: One level Home Equipment: Agricultural consultant (2 wheels);Rollator (4 wheels);BSC/3in1;Shower seat;Grab bars - toilet;Grab bars - tub/shower      Prior Function Prior Level of Function : Independent/Modified Independent;History of Falls (last six months)             Mobility Comments: Per DIL report, pt has not gotten off of the couch in 2 weeks. Since being on the couch, he has not gotten up to use the bathroom and was using toileting pads to void. Prior to this, pt was IND with household/community ambulation without AD. Hx of 1 fall in Dec 2024 where he slipped in bathroom; hit head. PT has not driven in about a year. ADLs Comments: Per DILreport, pt did not bathe or change clothes when he was on the couch for 2 weeks. Due to dementia, pt will sometimes refuse shower (opting for sponge baths) and eating.  Otherwise is IND with ADL's. Family assists with IADL's and medication management.     Extremity/Trunk Assessment   Upper Extremity Assessment Upper Extremity Assessment: Overall WFL for tasks assessed (difficult to assess due to difficulty with command following; able to WB through UE for transfers and gait with RW without buckling indicating at least 3+/5. BUE AROM grossly WFL.)    Lower Extremity Assessment Lower Extremity Assessment: Overall WFL for tasks assessed (difficult to assess due to difficulty with command following; able to WB through LE for transfers and gait with RW without buckling indicating at least 3+/5. BUE AROM grossly [facility].)       Communication   Communication Communication: Hearing impairment Cueing Techniques: Verbal cues;Tactile cues;Gestural cues  Cognition Arousal: Alert   Overall Cognitive Status: History of cognitive impairments - at baseline                                 General Comments: DIL endorses recent dx of dementia. Pt very HOH. Unable to report name, location, or situation when asked multiple times.        General Comments      Exercises Other Exercises Other Exercises: Participates in bed mobility, transfers, gait, and voiding at toilet. Uses RW for mobility with gross supervision-CGA for safety with mobility. Other Exercises: Limited pt education due to hearing deficit and AMS; pt educated re: PT role/POC, safety with functional mobility. He verbalized understanding.  Assessment/Plan    PT Assessment Patient needs continued PT services  PT Problem List Decreased activity tolerance;Decreased balance;Decreased mobility;Decreased cognition;Decreased safety awareness       PT Treatment Interventions DME instruction;Gait training;Functional mobility training;Therapeutic activities;Therapeutic exercise;Balance training;Neuromuscular re-education;Cognitive remediation;Patient/family education    PT Goals (Current goals can  be found in the Care Plan section)  Acute Rehab PT Goals Patient Stated Goal: short term rehab PT Goal Formulation: With family Time For Goal Achievement: 02/20/23 Potential to Achieve Goals: Good    Frequency Min 1X/week        AM-PAC PT "6 Clicks" Mobility  Outcome Measure Help needed turning from your back to your side while in a flat bed without using bedrails?: A Little Help needed moving from lying on your back to sitting on the side of a flat bed without using bedrails?: A Little Help needed moving to and from a bed to a chair (including a wheelchair)?: A Little Help needed standing up from a chair using your arms (e.g., wheelchair or bedside chair)?: A Little Help needed to walk in hospital room?: A Little Help needed climbing 3-5 steps with a railing? : A Lot 6 Click Score: 17    End of Session Equipment Utilized During Treatment: Gait belt Activity Tolerance: Patient tolerated treatment well Patient left: in bed;with call bell/phone within reach;with bed alarm set Nurse Communication: Mobility status PT Visit Diagnosis: Unsteadiness on feet (R26.81);History of falling (Z91.81);Difficulty in walking, not elsewhere classified (R26.2)    Time: 8295-6213 PT Time Calculation (min) (ACUTE ONLY): 22 min   Charges:   PT Evaluation $PT Eval Low Complexity: 1 Low   PT General Charges $$ ACUTE PT VISIT: 1 Visit         Vira Blanco, PT, DPT 11:54 AM,02/06/23 Physical Therapist - [facility]

## 2023-02-06 NOTE — ED Notes (Signed)
Pt given lunch tray, pt stating he is not hungry at this time. Is eating fruit.

## 2023-02-06 NOTE — Progress Notes (Signed)
  PROGRESS NOTE    Christopher Fields  WUJ:[id number] DOB: [birth year] DOA: 02/05/2023 PCP: System, Provider Not In  ED01A/ED01A  LOS: 1 day   Brief hospital course:   Assessment & Plan: Christopher Fields is a 89 y.o. male with medical history significant of CAD, GERD, hypertension, and encephalopathy.  Per report, patient worsening confusion since today.  Family does report overall worsening confusion over multiple days to weeks at baseline.    Acute Encephalopathy --possibly due to hypoglycemia and/or UTI --treat both   Hypoglycemia  --BG down to 45 after presentation.  Has glipizide and metformin on home med list. --hold both --cont D5 --q4h BG  UTI  --pos for increased urinary frequency, per family --started on ceftriaxone on admission.  Urine cx wasn't ordered. --add on urine cx  --cont ceftriaxone for now  GERD --cont home PPI  Essential hypertension --hold home amlodipine for now  Hyponatremia --low 130's.   --monitor   DVT prophylaxis: Lovenox SQ Code Status: DNR  Family Communication:  Level of care: Med-Surg Dispo:   The patient is from: home Anticipated d/c is to: SNF rehab Anticipated d/c date is: 1-2 days   Subjective and Interval History:  Christopher Fields was hard of hearing.  He said "don't need nothing."     Objective: Vitals:   02/06/23 1400 02/06/23 1410 02/06/23 1430 02/06/23 1500  BP: (!) 105/54  119/70 (!) 101/52  Pulse: (!) 54  (!) 50 (!) 48  Resp: (!) 23  20 14   Temp:  97.7 F (36.5 C)    TempSrc:  Oral    SpO2: 95%  100% 92%  Weight:      Height:       No intake or output data in the 24 hours ending 02/06/23 1636 Filed Weights   02/05/23 0925  Weight: 59 kg    Examination:   Constitutional: NAD, alert HEENT: conjunctivae and lids normal, EOMI, hard of hearing CV: No cyanosis.   RESP: normal respiratory effort, on RA Neuro: II - XII grossly intact.     Data Reviewed: I have personally reviewed labs and imaging studies  Time  spent: 50 minutes  Darlin Priestly, MD [facility] If 7PM-7AM, please contact night-coverage 02/06/2023, 4:36 PM

## 2023-02-06 NOTE — NC FL2 (Signed)
[place] MEDICAID FL2 LEVEL OF CARE FORM     IDENTIFICATION  Patient Name: Christopher Fields Birthdate: May 05, 1934 Sex: male Admission Date (Current Location): 02/05/2023  [facility] [place] Number:  Chiropodist and Address:  [facility], [address], [place], [place] [address]      Provider Number: [id number]  Attending Physician Name and Address:  Darlin Priestly, MD  Relative Name and Phone Number:       Current Level of Care: Hospital Recommended Level of Care: Skilled Nursing Facility Prior Approval Number:    Date Approved/Denied: 02/06/23 PASRR Number: [id number]  Discharge Plan: SNF    Current Diagnoses: Patient Active Problem List   Diagnosis Date Noted   Hypoglycemia 02/05/2023   Encephalopathy 02/05/2023   Hypokalemia 08/24/2018   Abnormal LFTs 08/24/2018   Hyponatremia 08/24/2018   Dehydration    Vomiting    Esophageal dysphagia 04/17/2013   History of colonic polyps 04/17/2013   Dysphagia 01/01/2011   FH: colon cancer 01/01/2011   ALLERGIC RHINITIS 02/06/2009   Type 2 diabetes mellitus (HCC) 12/07/2007   DECREASED HEARING 09/21/2007   BENIGN PROSTATIC HYPERTROPHY, WITH OBSTRUCTION 07/15/2006   Hyperlipidemia 12/28/2005   Essential hypertension 12/28/2005   Coronary atherosclerosis 12/28/2005   GERD 12/28/2005    Orientation RESPIRATION BLADDER Height & Weight     Self  Normal Continent Weight: 130 lb (59 kg) Height:  5\' 9"  (175.3 cm)  BEHAVIORAL SYMPTOMS/MOOD NEUROLOGICAL BOWEL NUTRITION STATUS      Continent Diet (heart healthy, thin liquids)  AMBULATORY STATUS COMMUNICATION OF NEEDS Skin   Limited Assist Verbally Normal                       Personal Care Assistance Level of Assistance  Bathing, Feeding, Dressing Bathing Assistance: Limited assistance Feeding assistance: Independent Dressing Assistance: Limited assistance     Functional Limitations Info  Sight, Hearing, Speech Sight Info:  Adequate Hearing Info: Adequate Speech Info: Adequate    SPECIAL CARE FACTORS FREQUENCY  PT (By licensed PT), OT (By licensed OT)     PT Frequency: 5x OT Frequency: 5x            Contractures Contractures Info: Not present    Additional Factors Info  Code Status, Allergies Code Status Info: DNR Allergies Info: Penicillins           Current Medications (02/06/2023):  This is the current hospital active medication list Current Facility-Administered Medications  Medication Dose Route Frequency Provider Last Rate Last Admin   cefTRIAXone (ROCEPHIN) 1 g in sodium chloride 0.9 % 100 mL IVPB  1 g Intravenous Q24H Floydene Flock, MD   Stopped at 02/05/23 1522   dextrose 5 % and 0.45 % NaCl infusion   Intravenous Continuous Floydene Flock, MD 75 mL/hr at 02/06/23 0822 Rate Verify at 02/06/23 0822   enoxaparin (LOVENOX) injection 40 mg  40 mg Subcutaneous Q24H Floydene Flock, MD   40 mg at 02/05/23 2201   ondansetron (ZOFRAN) tablet 4 mg  4 mg Oral Q6H PRN Floydene Flock, MD       Or   ondansetron [facility]) injection 4 mg  4 mg Intravenous Q6H PRN Floydene Flock, MD       Current Outpatient Medications  Medication Sig Dispense Refill   amLODipine (NORVASC) 5 MG tablet Take 5 mg by mouth daily.     aspirin 81 MG tablet Take 81 mg by mouth daily.  atorvastatin (LIPITOR) 80 MG tablet Take 80 mg by mouth daily.     benzonatate (TESSALON) 100 MG capsule Take 100 mg by mouth 3 (three) times daily.     CHEST CONGESTION RELIEF 400 MG TABS tablet Take 400 mg by mouth every 6 (six) hours as needed (congestion).     citalopram (CELEXA) 20 MG tablet Take 20 mg by mouth daily.     glipiZIDE (GLUCOTROL) 5 MG tablet Take 1 tablet (5 mg total) by mouth daily before breakfast. 30 tablet 0   metFORMIN (GLUCOPHAGE) 500 MG tablet Take 1,000 mg by mouth 2 (two) times daily.     pantoprazole (PROTONIX) 40 MG tablet Take 1 tablet (40 mg total) by mouth daily. 30 tablet 0   potassium  chloride SA (K-DUR) 20 MEQ tablet Take 1 tablet (20 mEq total) by mouth daily. 15 tablet 0   gabapentin (NEURONTIN) 100 MG capsule Take 1 capsule (100 mg total) by mouth 3 (three) times daily as needed (neuropathy pain). (Patient not taking: Reported on 02/05/2023) 90 capsule 0     Discharge Medications: Please see discharge summary for a list of discharge medications.  Relevant Imaging Results:  Relevant Lab Results:   Additional Information SSN:[id number]  Maree Krabbe, LCSW

## 2023-02-06 NOTE — ED Notes (Signed)
Pt slid up in bed by this RN and Paulino Rily. Pt asking for tv turned on.

## 2023-02-06 NOTE — ED Notes (Signed)
Peri care performed. Dry brief applied.

## 2023-02-06 NOTE — Evaluation (Signed)
Clinical/Bedside Swallow Evaluation Patient Details  Name: Christopher Fields MRN: [id number] Date of Birth: Dec 21, 1934  Today's Date: 02/06/2023 Time: SLP Start Time (ACUTE ONLY): 0902 SLP Stop Time (ACUTE ONLY): 1000 SLP Time Calculation (min) (ACUTE ONLY): 58 min  Past Medical History:  Past Medical History:  Diagnosis Date   CAD (coronary artery disease)    Diabetes mellitus    GERD (gastroesophageal reflux disease)    Hypertension    Sleep apnea    Has never used his machine   Past Surgical History:  Past Surgical History:  Procedure Laterality Date   CARPAL TUNNEL RELEASE Right 02/24/2016   Procedure: RIGHT CARPAL TUNNEL RELEASE;  Surgeon: Cindee Salt, MD;  Location: [facility];  Service: Orthopedics;  Laterality: Right;   COLONOSCOPY  07/01/06   normal colon colonic mucosa appeared normal   CORONARY ANGIOPLASTY WITH STENT PLACEMENT  2007   ESOPHAGEAL DILATION N/A 08/25/2018   Procedure: ESOPHAGEAL DILATION;  Surgeon: [name], MD;  [facility];  Service: Endoscopy;  Laterality: N/A;   ESOPHAGOGASTRODUODENOSCOPY  01/21/2011   RMR: Hiatal hernia. 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 Gastric erosions-status post biopsy (no h.pyori)   ESOPHAGOGASTRODUODENOSCOPY N/A 08/25/2018   Procedure: ESOPHAGOGASTRODUODENOSCOPY (EGD);  Surgeon: [name], MD;  [facility]: [facility];  Service: Endoscopy;  Laterality: N/A;   ESOPHAGOGASTRODUODENOSCOPY (EGD) WITH ESOPHAGEAL DILATION N/A 05/02/2013   Dr. Jena Gauss: gastritis, probable cervical esophageal web   HPI:  Pt is a 89 y.o. male with medical history significant of CAD, GERD, Hiatal Hernia, EGDs w/ dilation, hypertension, and encephalopathy.  Per report, patient worsening confusion. Blood sugars in the 50s at admit.  Family does report overall worsening Confusion over multiple days to weeks at Baseline.  No fevers or chills.  Family reports overall decreased p.o. intake.  Patient has refused  food.  No reports of chest pain or shortness of breath.  Minimal cough.  Positive recurrent increased urinary frequency per the family.  Family is concerned about overall functional status.  Is considering nursing home for the patient.   CXR: Low lung volumes. No acute findings.    Head CTs: Moderate-advanced age-related atrophy and chronic microvascular  ischemic changes. There is no acute intracranial hemorrhage.    Assessment / Plan / Recommendation  Clinical Impression   Pt seen for BSE this morning. Pt awake, verbally responded to questions re: self. Followed 1-2 step commands w/ cue. Needed tray setup and sitting up support. Assisted pt w/ breakfast meal uneaten at bedside. Severely HOH.  On RA. Afebrile. WBC 5.0.  Pt appears to present w/ grossly functional oropharyngeal phase swallowing in setting of No Bottom Dentition w/ No overt oropharyngeal phase dysphagia noted, No neuromuscular deficits noted. Pt consumed po trials w/ No immediate, overt clinical s/s of aspiration during po trials.  Pt appears at reduced risk for aspiration/aspiration pneumonia when following general aspiration precautions w/ a soft, cooked foods diet and foods well-cut for ease of chewing.  Pt does have challenging factors that could impact oropharyngeal swallowing to include deconditioning/weakness, advanced age, NO bottom Dentition for effective mastication of solid foods, and acute hospitalization.  These factors can increase risk for dysphagia as well as decreased oral intake overall.   During po trials, pt consumed all consistencies w/ no overt coughing, decline in vocal quality, or change in respiratory presentation during/post trials. O2 sats 98%, RR 19. Oral phase appeared grossly [facility] w/ timely bolus management, mastication, and control of bolus propulsion for A-P  transfer for swallowing. Oral clearing achieved w/ all trial consistencies -- moistened, soft foods given. Noted pt often "swished" to aid clearing mouth  when drinking the liquids post foods.  OM Exam appeared [facility] w/ no unilateral weakness noted. Speech Clear. Pt fed self w/ setup support.   Recommend a Mech Soft/Regular consistency diet w/ well-Cut meats, moistened foods; Thin liquids -- monitor Upright sitting for oral intake; Cup drinking -- NO Straws. Recommend general aspiration precautions, reduce distractions at meals/talking. Tray setup and sitting up support at meals. Pills WHOLE in Puree for safer, easier swallowing.  Education given on Pills in Puree; food consistencies and easy to eat options; general aspiration precautions to pt and Dtr. MD to reconsult if any new needs arise during admit. NSG updated, agreed. MD updated. Recommend Dietician and Palliative Care f/u for support. SLP Visit Diagnosis: Dysphagia, unspecified (R13.10)    Aspiration Risk   (reuced following general aspiration precautions)    Diet Recommendation   Thin;Dysphagia 3 (mechanical soft);Age appropriate regular (cut moistened meats/foods) = a Mech Soft/Regular consistency diet w/ well-Cut meats, moistened foods; Thin liquids -- monitor Upright sitting for oral intake; Cup drinking -- NO Straws. Recommend general aspiration precautions, reduce distractions at meals/talking. Tray setup and sitting up support at meals.   Medication Administration: Whole meds with puree    Other  Recommendations Recommended Consults:  (Dietician; Palliative Care consult for GOC/support) Oral Care Recommendations: Oral care BID;Oral care before and after PO;Staff/trained caregiver to provide oral care (denture care)    Recommendations for follow up therapy are one component of a multi-disciplinary discharge planning process, led by the attending physician.  Recommendations may be updated based on patient status, additional functional criteria and insurance authorization.  Follow up Recommendations No SLP follow up      Assistance Recommended at Discharge  Intermittent  Functional  Status Assessment Patient has had a recent decline in their functional status and demonstrates the ability to make significant improvements in function in a reasonable and predictable amount of time.  Frequency and Duration  (n/a)   (n/a)       Prognosis Prognosis for improved oropharyngeal function: Good Barriers to Reach Goals:  (deconditioning; advanced age)      Swallow Study   General Date of Onset: 02/05/23 HPI: Pt is a 89 y.o. male with medical history significant of CAD, GERD, Hiatal Hernia, EGDs w/ dilation, hypertension, and encephalopathy.  Per report, patient worsening confusion. Blood sugars in the 50s at admit.  Family does report overall worsening Confusion over multiple days to weeks at Baseline.  No fevers or chills.  Family reports overall decreased p.o. intake.  Patient has refused food.  No reports of chest pain or shortness of breath.  Minimal cough.  Positive recurrent increased urinary frequency per the family.  Family is concerned about overall functional status.  Is considering nursing home for the patient.   CXR: Low lung volumes. No acute findings.    Head CTs: Moderate-advanced age-related atrophy and chronic microvascular  ischemic changes. There is no acute intracranial hemorrhage. Type of Study: Bedside Swallow Evaluation Previous Swallow Assessment: none Diet Prior to this Study: Regular;Thin liquids (Level 0) (per MD) Temperature Spikes Noted: No (wbc 5.0) Respiratory Status: Room air History of Recent Intubation: No Behavior/Cognition: Alert;Cooperative;Pleasant mood;Distractible;Requires cueing (extremely HOH) Oral Cavity Assessment: Within Functional Limits Oral Care Completed by SLP: Yes Oral Cavity - Dentition: Dentures, top (No bottom Dentition) Vision: Functional for self-feeding Self-Feeding Abilities: Able to feed self;Needs assist;Needs set  up Patient Positioning: Upright in bed (MAX assist to position the bed) Baseline Vocal Quality:  Normal Volitional Cough: Strong Volitional Swallow: Able to elicit    Oral/Motor/Sensory Function Overall Oral Motor/Sensory Function: Within functional limits   Ice Chips Ice chips: Within functional limits Presentation: Spoon (fed; 3 trials)   Thin Liquid Thin Liquid: Within functional limits Presentation: Cup;Self Fed (~5-6 ozs total) Other Comments: water, juice    Nectar Thick Nectar Thick Liquid: Not tested   Honey Thick Honey Thick Liquid: Not tested   Puree Puree: Within functional limits Presentation: Self Fed;Spoon (~3 ozs)   Solid     Solid: Within functional limits (softened foods) Presentation: Self Fed;Spoon (12+ trials) Other Comments: eggs, potatoes        Jerilynn Som, MS, CCC-SLP Speech Language Pathologist Rehab Services; [facility] [phone] (ascom) Jeslie Lowe 02/06/2023,10:08 AM

## 2023-02-06 NOTE — ED Notes (Signed)
Pt asking for lights to be turned out and HOB lowered.

## 2023-02-06 NOTE — TOC Progression Note (Signed)
Transition of Care [facility]) - Progression Note    Patient Details  Name: Christopher Fields MRN: [id number] Date of Birth: 16-May-1934  [facility] CM/SW Contact  Maree Krabbe, LCSW Phone Number: 02/06/2023, 12:11 PM  Clinical Narrative:   PT spoke with daughter in law who states they are agreeable to SNF and prefer [facility]. SW has sent referral to [facility].     Expected Discharge Plan:  (SNF VS HOME) Barriers to Discharge: Continued Medical Work up  Expected Discharge Plan and Services In-house Referral: Clinical Social Work     Living arrangements for the past 2 months: Single Family Home                                       Social Determinants of Health (SDOH) Interventions SDOH Screenings   Tobacco Use: Medium Risk (02/05/2023)    Readmission Risk Interventions     No data to display

## 2023-02-06 NOTE — ED Notes (Signed)
Pt voicing need to urinate. Pt stood at bedside with urinal to void. Pt placed back in bed without issue.

## 2023-02-06 NOTE — ED Notes (Signed)
Spoke with pt's dtr-in-law, Pieter Partridge, via telephone and updated her on pt status.

## 2023-02-07 DIAGNOSIS — E162 Hypoglycemia, unspecified: Secondary | ICD-10-CM | POA: Diagnosis not present

## 2023-02-07 LAB — URINE CULTURE: Culture: NO GROWTH

## 2023-02-07 LAB — CBG MONITORING, ED
Glucose-Capillary: 124 mg/dL — ABNORMAL HIGH (ref 70–99)
Glucose-Capillary: 89 mg/dL (ref 70–99)
Glucose-Capillary: 218 mg/dL — ABNORMAL HIGH (ref 70–99)

## 2023-02-07 LAB — GLUCOSE, CAPILLARY: Glucose-Capillary: 135 mg/dL — ABNORMAL HIGH (ref 70–99)

## 2023-02-07 NOTE — Evaluation (Signed)
Occupational Therapy Evaluation Patient Details Name: Christopher Fields MRN: [id number] DOB: 12-19-1934 Today's Date: 02/07/2023   History of Present Illness Pt admitted to [facility] on 02/05/23 for c/o AMS and hypoglycemia. Encephalopathy likely multifactorial with contributions of dementia, symptomatic hypoglycemia and UTI. Significant PMH includes: DM, GERD, HTN, CAD. Imaging negative for acute abnormality.   Clinical Impression   Pt HOH and poor historian; PLOF and home setup from chart review. Pt with recent functional decline, unable to get off couch and using toileting pads for voiding. Prior to several weeks ago, pt was independent with mobility and ADLs (sponge bathing) with family providing assist with IADLs. Pt alert only to self.   Pt follows commands inconsistently, delays noted in processing, deficits in balance, safety awareness, HOH and cognition limit safe, efficient performance in ADLs and mobility. Pt performing below functional baseline, requiring supervision for bed mobility, CGA for transfers and mobility, and CGA-minA for ADLs. Pt will require 24/7 supervision at discharge due to current deficits. Patient will benefit from continued inpatient follow up therapy, <3 hours/day.       If plan is discharge home, recommend the following: A little help with walking and/or transfers;A little help with bathing/dressing/bathroom;Assistance with cooking/housework;Direct supervision/assist for medications management;Direct supervision/assist for financial management;Assist for transportation;Help with stairs or ramp for entrance;Supervision due to cognitive status    Functional Status Assessment  Patient has had a recent decline in their functional status and demonstrates the ability to make significant improvements in function in a reasonable and predictable amount of time.  Equipment Recommendations  Other (comment) (defer to  next LOC)       Precautions / Restrictions  Precautions Precautions: Fall Restrictions Weight Bearing Restrictions Per Provider Order: No      Mobility Bed Mobility Overal bed mobility: Needs Assistance Bed Mobility: Supine to Sit, Sit to Supine     Supine to sit: Supervision Sit to supine: Supervision   General bed mobility comments: Supervision for safety to perform supine<>Sit transfer to exit bed to the RIGHT. Increased reliance of UE support to facilitate transfers.    Transfers Overall transfer level: Needs assistance Equipment used: Rolling walker (2 wheels) Transfers: Sit to/from Stand Sit to Stand: Contact guard assist                  Balance Overall balance assessment: Needs assistance Sitting-balance support: No upper extremity supported, Feet supported Sitting balance-Leahy Scale: Good     Standing balance support: Bilateral upper extremity supported, No upper extremity supported, During functional activity Standing balance-Leahy Scale: Fair                             ADL either performed or assessed with clinical judgement   ADL Overall ADL's : Needs assistance/impaired Eating/Feeding: Set up;Sitting Eating/Feeding Details (indicate cue type and reason): pt eating and drinking when OT enters roo Grooming: Wash/dry hands;Wash/dry face;Sitting Grooming Details (indicate cue type and reason): pt provided with washcloth and washed face sitting in chair Upper Body Bathing: Set up;Sitting   Lower Body Bathing: Minimal assistance;Sit to/from stand;Sitting/lateral leans;Cueing for safety   Upper Body Dressing : Sitting;Set up   Lower Body Dressing: Sit to/from stand;Sitting/lateral leans;Minimal assistance   Toilet Transfer: Minimal assistance;Rolling walker (2 wheels);Ambulation Toilet Transfer Details (indicate cue type and reason): minA for cuing, and assist to power up Toileting- Clothing Manipulation and Hygiene: Maximal assistance;Sit to/from stand       Functional mobility  during  ADLs: Contact guard assist;Rolling walker (2 wheels) General ADL Comments: ADL performance impacted by deficits in hearing and cognition. Pt responds to questions 20% of time. Pt performing short bout of mobility t/f chair in room with RW, CGA for  balance, dons/doffs socks seated with supervision and washes face.     Vision Baseline Vision/History:  (unsure due to cognition)              Pertinent Vitals/Pain Pain Assessment Pain Assessment: No/denies pain     Extremity/Trunk Assessment Upper Extremity Assessment Upper Extremity Assessment: Difficult to assess due to impaired cognition;Overall WFL for tasks assessed (pt uses RW with BUE, washes face with WFL ROM and coordination. Could not follow directions for formal assessment)   Lower Extremity Assessment Lower Extremity Assessment: Overall WFL for tasks assessed;Difficult to assess due to impaired cognition       Communication Communication Communication: Hearing impairment Cueing Techniques: Verbal cues;Tactile cues;Visual cues   Cognition Arousal: Alert Behavior During Therapy: WFL for tasks assessed/performed Overall Cognitive Status: History of cognitive impairments - at baseline                                 General Comments: very HOH, delayed in answering questions or does not respond despite multiple repititions                Home Living Family/patient expects to be discharged to:: Private residence (lives at son/DIL's house) Living Arrangements: Children;Other relatives (lives with son, DIL, grandson, and grandsons girlfriend) Available Help at Discharge: Family (DIL is amputee and available for 24/7 supervision. Son and grandson work and able to provide PRN care (work during the day, long hours). Grandson's girlfriend available does not work, questionable amount of assist) Type of Home: House Home Access: Level entry     Home Layout: One level     Bathroom Shower/Tub: Retail banker: Standard Bathroom Accessibility: Yes   Home Equipment: Agricultural consultant (2 wheels);Rollator (4 wheels);BSC/3in1;Shower seat;Grab bars - toilet;Grab bars - tub/shower          Prior Functioning/Environment Prior Level of Function : Independent/Modified Independent;History of Falls (last six months);Patient poor historian/Family not available (info from chart review)             Mobility Comments: Per DIL report, pt has not gotten off of the couch in 2 weeks. Since being on the couch, he has not gotten up to use the bathroom and was using toileting pads to void. Prior to this, pt was IND with household/community ambulation without AD. Hx of 1 fall in Dec 2024 where he slipped in bathroom; hit head. PT has not driven in about a year. ADLs Comments: Per DILreport, pt did not bathe or change clothes when he was on the couch for 2 weeks. Due to dementia, pt will sometimes refuse shower (opting for sponge baths) and eating. Otherwise is IND with ADL's. Family assists with IADL's and medication management.        OT Problem List: Decreased strength;Decreased activity tolerance;Impaired balance (sitting and/or standing);Decreased cognition;Decreased knowledge of use of DME or AE      OT Treatment/Interventions: Self-care/ADL training;Neuromuscular education;Energy conservation;Therapeutic exercise;DME and/or AE instruction;Therapeutic activities;Cognitive remediation/compensation;Patient/family education;Balance training    OT Goals(Current goals can be found in the care plan section) Acute Rehab OT Goals OT Goal Formulation: Patient unable to participate in goal setting Time For Goal Achievement: 02/21/23 Potential to Achieve  Goals: Fair  OT Frequency: Min 1X/week       AM-PAC OT "6 Clicks" Daily Activity     Outcome Measure Help from another person eating meals?: None Help from another person taking care of personal grooming?: A Little Help from another person  toileting, which includes using toliet, bedpan, or urinal?: A Little Help from another person bathing (including washing, rinsing, drying)?: A Little Help from another person to put on and taking off regular upper body clothing?: A Little Help from another person to put on and taking off regular lower body clothing?: A Little 6 Click Score: 19   End of Session Equipment Utilized During Treatment: Gait belt;Rolling walker (2 wheels)  Activity Tolerance: Patient tolerated treatment well Patient left: in bed;with call bell/phone within reach;with bed alarm set  OT Visit Diagnosis: Unsteadiness on feet (R26.81);Other abnormalities of gait and mobility (R26.89)                Time: 4782-9562 OT Time Calculation (min): 17 min Charges:  OT General Charges $OT Visit: 1 Visit OT Evaluation $OT Eval Low Complexity: 1 Low  Kavi Almquist L. Laree Garron, OTR/L  02/07/23, 11:12 AM

## 2023-02-07 NOTE — ED Notes (Signed)
Pt is asking about wallet and shoes.  Per previous Primary RN, Pt did not have a wallet or shoes.  Previous room was double checked before another Pt was placed.

## 2023-02-07 NOTE — ED Notes (Signed)
Pt able to walk to restroom and back w/ 1 staff assist.  Pt able to follow commands, but very hard of hearing.

## 2023-02-07 NOTE — Plan of Care (Signed)
  Problem: Education: Goal: Knowledge of General Education information will improve Description: Including pain rating scale, medication(s)/side effects and non-pharmacologic comfort measures Outcome: Not Progressing   Problem: Health Behavior/Discharge Planning: Goal: Ability to manage health-related needs will improve Outcome: Not Progressing  Patient just arrived to the unit. Baseline dementia

## 2023-02-07 NOTE — ED Notes (Signed)
Pt's bed alarm heard and pt attempting to get out of bed to use urinal. I explained to pt that he should use his call bell for assistance to prevent a fall. Pt expressed verbal understanding. I assisted pt to stand at bedside to use urinal. Pt returned to bed and comfort measures applied (warm blanket, lights dimmed, extra pillow provided)

## 2023-02-07 NOTE — Progress Notes (Signed)
  PROGRESS NOTE    Christopher Fields  FGH:[id number] DOB: 11/26/1934 DOA: 02/05/2023 PCP: System, Provider Not In  210A/210A-AA  LOS: 2 days   Brief hospital course:   Assessment & Plan: Christopher Fields is a 89 y.o. male with medical history significant of CAD, GERD, hypertension, and encephalopathy.  Per report, patient worsening confusion since today.  Family does report overall worsening confusion over multiple days to weeks at baseline.    Acute Encephalopathy --due to hypoglycemia.  Mental status improved today.  Hypoglycemia  --BG down to 45 after presentation.  Has glipizide and metformin on home med list. --s/p D5 gtt.  Hypoglycemia resolved. --d/c BG checks  UTI, ruled out --pos for increased urinary frequency, per family --started on ceftriaxone on admission.  Urine cx wasn't ordered on initial presentation, but added on later and was neg growth. --d/c ceftriaxone  GERD --cont home PPI  Essential hypertension --hold home amlodipine for now  Hyponatremia --low 130's.   --monitor  Weakness --PT/OT   DVT prophylaxis: Lovenox SQ Code Status: DNR  Family Communication:  Level of care: Med-Surg Dispo:   The patient is from: home Anticipated d/c is to: SNF rehab Anticipated d/c date is: whenever bed available    Subjective and Interval History:  Pt ate.  Pt was asking about his wallet.   Objective: Vitals:   02/07/23 1530 02/07/23 1649 02/07/23 1728 02/07/23 2012  BP: (!) 110/54 (!) 113/53  (!) 101/43  Pulse: (!) 59 (!) 59  64  Resp: 16 16  18   Temp:   97.9 F (36.6 C) 97.6 F (36.4 C)  TempSrc:   Axillary Oral  SpO2: 95% 96%  95%  Weight:      Height:        Intake/Output Summary (Last 24 hours) at 02/07/2023 2045 Last data filed at 02/07/2023 1740 Gross per 24 hour  Intake 180 ml  Output 300 ml  Net -120 ml   Filed Weights   02/05/23 0925  Weight: 59 kg    Examination:   Constitutional: NAD, AAOx3 HEENT: conjunctivae and lids  normal, EOMI, hard of hearing CV: No cyanosis.   RESP: normal respiratory effort, on RA Neuro: II - XII grossly intact.   Psych: Normal mood and affect.     Data Reviewed: I have personally reviewed labs and imaging studies  Time spent: 35 minutes  Darlin Priestly, MD [facility] If 7PM-7AM, please contact night-coverage 02/07/2023, 8:45 PM

## 2023-02-08 DIAGNOSIS — E162 Hypoglycemia, unspecified: Secondary | ICD-10-CM | POA: Diagnosis not present

## 2023-02-08 NOTE — Progress Notes (Signed)
Physical Therapy Treatment Patient Details Name: Christopher Fields MRN: [id number] DOB: 06-28-1934 Today's Date: 02/08/2023   History of Present Illness Pt admitted to [facility] on 02/05/23 for c/o AMS and hypoglycemia. Encephalopathy likely multifactorial with contributions of dementia, symptomatic hypoglycemia and UTI. Significant PMH includes: DM, GERD, HTN, CAD. Imaging negative for acute abnormality.    PT Comments  Pt in recliner on entry, trying to lower legs unsuccessful- pt reports he is cold and wants to take a nap. Pt's hearing aids are not donned, but in his gown pocket- I put these in a labeled pink tooth box to avoid inadvertent disposal with rubbish. Communication successful through use of live captioning app. Pt agreeable to some AMB, able to AMB >138ft in room without LOB, without UE support, denies any sense of acute weakness or imbalance. Pt assisted back to bed at end of session, all needs met. Pt moving much improved, will update DC recs to reflect progress made.    If plan is discharge home, recommend the following: A little help with walking and/or transfers;A little help with bathing/dressing/bathroom;Assistance with cooking/housework;Direct supervision/assist for medications management;Assist for transportation;Help with stairs or ramp for entrance;Supervision due to cognitive status   Can travel by private vehicle     No  Equipment Recommendations  None recommended by PT    Recommendations for Other Services       Precautions / Restrictions Precautions Precautions: Fall Restrictions Weight Bearing Restrictions Per Provider Order: No     Mobility  Bed Mobility Overal bed mobility: Modified Independent         Sit to supine: Modified independent (Device/Increase time)        Transfers Overall transfer level: Needs assistance Equipment used: None Transfers: Sit to/from Stand Sit to Stand: Supervision                Ambulation/Gait Ambulation/Gait  assistance: Contact guard assist, Supervision Gait Distance (Feet): 180 Feet Assistive device: None Gait Pattern/deviations: WFL(Within Functional Limits)       General Gait Details: 3 laps in room, pt says he feels good, denies any acute weakness or worse balance issues   Stairs             Wheelchair Mobility     Tilt Bed    Modified Rankin (Stroke Patients Only)       Balance Overall balance assessment:  (intermittent hand support)                                          Cognition Arousal: Alert Behavior During Therapy: WFL for tasks assessed/performed Overall Cognitive Status: Within Functional Limits for tasks assessed                                          Exercises      General Comments        Pertinent Vitals/Pain Pain Assessment Pain Assessment: No/denies pain    Home Living                          Prior Function            PT Goals (current goals can now be found in the care plan section) Acute Rehab PT Goals Patient Stated Goal: short term  rehab PT Goal Formulation: With family Time For Goal Achievement: 02/20/23 Potential to Achieve Goals: Good Progress towards PT goals: Progressing toward goals    Frequency    Min 1X/week      PT Plan      Co-evaluation              AM-PAC PT "6 Clicks" Mobility   Outcome Measure  Help needed turning from your back to your side while in a flat bed without using bedrails?: A Little Help needed moving from lying on your back to sitting on the side of a flat bed without using bedrails?: A Little Help needed moving to and from a bed to a chair (including a wheelchair)?: A Little Help needed standing up from a chair using your arms (e.g., wheelchair or bedside chair)?: A Little Help needed to walk in hospital room?: A Little Help needed climbing 3-5 steps with a railing? : A Little 6 Click Score: 18    End of Session   Activity  Tolerance: Patient tolerated treatment well;No increased pain Patient left: in bed;with call bell/phone within reach;with bed alarm set Nurse Communication: Mobility status PT Visit Diagnosis: Unsteadiness on feet (R26.81);History of falling (Z91.81);Difficulty in walking, not elsewhere classified (R26.2)     Time: 6962-9528 PT Time Calculation (min) (ACUTE ONLY): 15 min  Charges:    $Therapeutic Activity: 8-22 mins PT General Charges $$ ACUTE PT VISIT: 1 Visit                    2:11 PM, 02/08/23 Rosamaria Lints, PT, DPT Physical Therapist - [facility]  [phone] (ASCOM)    Braelynn Benning C 02/08/2023, 2:07 PM

## 2023-02-08 NOTE — Discharge Summary (Signed)
Physician Discharge Summary   Christopher Fields  male DOB: July 18, 1934  ZOX:[id number]  PCP: System, Provider Not In  Admit date: 02/05/2023 Discharge date: 02/08/2023  Admitted From: home Disposition:  home Daughter-in-law updated on the phone prior to discharge. Home Health: Yes CODE STATUS: DNR  Discharge Instructions     Discharge instructions   Complete by: As directed    Your mental status change and weakness were likely due to low blood sugar caused by glipizide.  Your A1c is only 6.2.  Please stop taking glipizide, but can continue taking metformin. - -   No wound care   Complete by: As directed       Hospital Course:  For full details, please see H&P, progress notes, consult notes and ancillary notes.  Briefly,  Christopher Fields is a 89 y.o. male with medical history significant of CAD, hypertension, and DM2 who presented with overall worsening confusion.    Acute metabolic Encephalopathy --due to hypoglycemia.  Mental status improved prior to discharge.   Hypoglycemia  DM2 --BG down to 45 after presentation.  Has glipizide and metformin on home med list. --s/p D5 gtt.  Hypoglycemia resolved. --A1c only 6.2, pt likely had hypoglycemic events at home with glipizide.  Glipizide d/c'ed.  Metformin to be resumed after discharge.   UTI, ruled out --pos for increased urinary frequency, per family. --started on ceftriaxone on admission.  Urine cx wasn't ordered on initial presentation, but added on later to original collection and was neg growth.   GERD --cont home PPI   Essential hypertension --home amlodipine to be resumed after discharge.   Hyponatremia --low 130's.     Weakness --PT/OT initially rec SNF rehab, however, mobility improved after pt's mental status improved, pt was therefore cleared to go home with [facility].   Unless noted above, medications under "STOP" list are ones pt was not taking PTA.  Discharge Diagnoses:  Principal Problem:    Hypoglycemia Active Problems:   Essential hypertension   GERD   Encephalopathy   30 Day Unplanned Readmission Risk Score    Flowsheet Row ED to Hosp-Admission (Current) from [facility]05/2023 in [facility]  30 Day Unplanned Readmission Risk Score (%) 14.01 Filed at 02/08/2023 1200       This score is the patient's risk of an unplanned readmission within 30 days of being discharged (0 -100%). The score is based on dignosis, age, lab data, medications, orders, and past utilization.   Low:  0-14.9   Medium: 15-21.9   High: 22-29.9   Extreme: 30 and above         Discharge Instructions:  Allergies as of 02/08/2023       Reactions   Penicillins Itching, Rash        Medication List     STOP taking these medications    benzonatate 100 MG capsule Commonly known as: TESSALON   gabapentin 100 MG capsule Commonly known as: NEURONTIN   glipiZIDE 5 MG tablet Commonly known as: GLUCOTROL   potassium chloride SA 20 MEQ tablet Commonly known as: KLOR-CON M       TAKE these medications    amLODipine 5 MG tablet Commonly known as: NORVASC Take 5 mg by mouth daily.   aspirin 81 MG tablet Take 81 mg by mouth daily.   atorvastatin 80 MG tablet Commonly known as: LIPITOR Take 80 mg by mouth daily.   Chest Congestion Relief 400 MG Tabs tablet Generic drug: guaifenesin Take 400 mg  by mouth every 6 (six) hours as needed (congestion).   citalopram 20 MG tablet Commonly known as: CELEXA Take 20 mg by mouth daily.   metFORMIN 500 MG tablet Commonly known as: GLUCOPHAGE Take 1,000 mg by mouth 2 (two) times daily.   pantoprazole 40 MG tablet Commonly known as: PROTONIX Take 1 tablet (40 mg total) by mouth daily.         Follow-up Information     your PCP Follow up in 1 week(s).                  Allergies  Allergen Reactions   Penicillins Itching and Rash     The results of significant diagnostics from this  hospitalization (including imaging, microbiology, ancillary and laboratory) are listed below for reference.   Consultations:   Procedures/Studies: DG Chest Portable 1 View Result Date: 02/05/2023 CLINICAL DATA:  Altered mental status. EXAM: PORTABLE CHEST 1 VIEW COMPARISON:  02/07/2018 FINDINGS: Heart size and mediastinal contours are unremarkable. Lung volumes are low. Linear area of scar identified within the left lower lung. No pleural effusion, airspace consolidation or pneumothorax. IMPRESSION: Low lung volumes. No acute findings. Electronically Signed   By: Signa Kell M.D.   On: 02/05/2023 13:17   CT HEAD WO CONTRAST ( ) Result Date: 02/05/2023 CLINICAL DATA:  Mental status change with unknown cause. EXAM: CT HEAD WITHOUT CONTRAST TECHNIQUE: Contiguous axial images were obtained from the base of the skull through the vertex without intravenous contrast. RADIATION DOSE REDUCTION: This exam was performed according to the departmental dose-optimization program which includes automated exposure control, adjustment of the mA and/or kV according to patient size and/or use of iterative reconstruction technique. COMPARISON:  01/08/2023 FINDINGS: Brain: Brain atrophy which is advanced in the anterior temporal lobes. Confluent chronic small vessel ischemia in the deep cerebral white matter. No evidence of acute infarct, hemorrhage, hydrocephalus, mass, or collection. Vascular: No hyperdense vessel or unexpected calcification. Skull: Normal. Negative for fracture or focal lesion. Sinuses/Orbits: No acute finding. IMPRESSION: 1. No acute or reversible finding. 2. Atrophy that is advanced in the anterior temporal lobes. Electronically Signed   By: Tiburcio Pea M.D.   On: 02/05/2023 10:22      Labs: BNP (last 3 results) No results for input(s): "BNP" in the last 8760 hours. Basic Metabolic Panel: Recent Labs  Lab 02/05/23 0924 02/06/23 0441  NA 130* 133*  K 4.1 3.7  CL 97* 100  CO2 21* 23   GLUCOSE 196* 135*  BUN 24* 15  CREATININE 1.06 0.87  CALCIUM 8.7* 8.4*   Liver Function Tests: Recent Labs  Lab 02/05/23 0924 02/06/23 0441  AST 25 19  ALT 13 10  ALKPHOS 91 78  BILITOT 0.7 0.5  PROT 7.2 6.5  ALBUMIN 3.4* 2.8*   No results for input(s): "LIPASE", "AMYLASE" in the last 168 hours. No results for input(s): "AMMONIA" in the last 168 hours. CBC: Recent Labs  Lab 02/05/23 0924 02/06/23 0441  WBC 7.9 5.0  HGB 13.9 12.8*  HCT 43.5 38.3*  MCV 90.6 84.9  PLT 310 285   Cardiac Enzymes: No results for input(s): "CKTOTAL", "CKMB", "CKMBINDEX", "TROPONINI" in the last 168 hours. BNP: Invalid input(s): "POCBNP" CBG: Recent Labs  Lab 02/06/23 2137 02/07/23 0059 02/07/23 0554 02/07/23 1152 02/07/23 1725  GLUCAP 129* 124* 89 218* 135*   D-Dimer No results for input(s): "DDIMER" in the last 72 hours. Hgb A1c Recent Labs    02/06/23 0440  HGBA1C 6.2*   Lipid Profile  No results for input(s): "CHOL", "HDL", "LDLCALC", "TRIG", "CHOLHDL", "LDLDIRECT" in the last 72 hours. Thyroid function studies No results for input(s): "TSH", "T4TOTAL", "T3FREE", "THYROIDAB" in the last 72 hours.  Invalid input(s): "FREET3" Anemia work up No results for input(s): "VITAMINB12", "FOLATE", "FERRITIN", "TIBC", "IRON", "RETICCTPCT" in the last 72 hours. Urinalysis    Component Value Date/Time   COLORURINE YELLOW (A) 02/05/2023 1222   APPEARANCEUR HAZY (A) 02/05/2023 1222   LABSPEC 1.021 02/05/2023 1222   PHURINE 6.0 02/05/2023 1222   GLUCOSEU NEGATIVE 02/05/2023 1222   HGBUR SMALL (A) 02/05/2023 1222   BILIRUBINUR NEGATIVE 02/05/2023 1222   KETONESUR NEGATIVE 02/05/2023 1222   PROTEINUR 30 (A) 02/05/2023 1222   NITRITE NEGATIVE 02/05/2023 1222   LEUKOCYTESUR TRACE (A) 02/05/2023 1222   Sepsis Labs Recent Labs  Lab 02/05/23 0924 02/06/23 0441  WBC 7.9 5.0   Microbiology Recent Results (from the past 240 hours)  Resp panel by RT-PCR (RSV, Flu A&B, Covid)  Anterior Nasal Swab     Status: None   Collection Time: 02/05/23  9:23 AM   Specimen: Anterior Nasal Swab  Result Value Ref Range Status   SARS Coronavirus 2 by RT PCR NEGATIVE NEGATIVE Final    Comment: (NOTE) SARS-CoV-2 target nucleic acids are NOT DETECTED.  The SARS-CoV-2 RNA is generally detectable in upper respiratory specimens during the acute phase of infection. The lowest concentration of SARS-CoV-2 viral copies this assay can detect is 138 copies/mL. A negative result does not preclude SARS-Cov-2 infection and should not be used as the sole basis for treatment or other patient management decisions. A negative result may occur with  improper specimen collection/handling, submission of specimen other than nasopharyngeal swab, presence of viral mutation(s) within the areas targeted by this assay, and inadequate number of viral copies(<138 copies/mL). A negative result must be combined with clinical observations, patient history, and epidemiological information. The expected result is Negative.  Fact Sheet for Patients:  BloggerCourse.com  Fact Sheet for Healthcare Providers:  SeriousBroker.it  This test is no t yet approved or cleared by the [place] FDA and  has been authorized for detection and/or diagnosis of SARS-CoV-2 by FDA under an Emergency Use Authorization (EUA). This EUA will remain  in effect (meaning this test can be used) for the duration of the COVID-19 declaration under Section 564(b)(1) of the Act, 21 U.S.C.section 360bbb-3(b)(1), unless the authorization is terminated  or revoked sooner.       Influenza A by PCR NEGATIVE NEGATIVE Final   Influenza B by PCR NEGATIVE NEGATIVE Final    Comment: (NOTE) The Xpert Xpress SARS-CoV-2/FLU/RSV plus assay is intended as an aid in the diagnosis of influenza from Nasopharyngeal swab specimens and should not be used as a sole basis for treatment. Nasal washings  and aspirates are unacceptable for Xpert Xpress SARS-CoV-2/FLU/RSV testing.  Fact Sheet for Patients: BloggerCourse.com  Fact Sheet for Healthcare Providers: SeriousBroker.it  This test is not yet approved or cleared by the [place] FDA and has been authorized for detection and/or diagnosis of SARS-CoV-2 by FDA under an Emergency Use Authorization (EUA). This EUA will remain in effect (meaning this test can be used) for the duration of the COVID-19 declaration under Section 564(b)(1) of the Act, 21 U.S.C. section 360bbb-3(b)(1), unless the authorization is terminated or revoked.     Resp Syncytial Virus by PCR NEGATIVE NEGATIVE Final    Comment: (NOTE) Fact Sheet for Patients: BloggerCourse.com  Fact Sheet for Healthcare Providers: SeriousBroker.it  This test is not  yet approved or cleared by the [place] and has been authorized for detection and/or diagnosis of SARS-CoV-2 by FDA under an Emergency Use Authorization (EUA). This EUA will remain in effect (meaning this test can be used) for the duration of the COVID-19 declaration under Section 564(b)(1) of the Act, 21 U.S.C. section 360bbb-3(b)(1), unless the authorization is terminated or revoked.  Performed at [facility], [address] [place], [place] [address]   Urine Culture (for pregnant, neutropenic or urologic patients or patients with an indwelling urinary catheter)     Status: None   Collection Time: 02/05/23 12:22 PM   Specimen: Urine, Clean Catch  Result Value Ref Range Status   Specimen Description   Final    URINE, CLEAN CATCH Performed at [facility], [address] [place], [place] [address]    Special Requests   Final    NONE Performed at [facility], [address], [place], [place] [address]    Culture   Final    NO GROWTH Performed at [facility], [address], [place], [place] [address]    Report Status 02/07/2023 FINAL  Final     Total time spend on discharging this patient, including the last patient exam, discussing the hospital stay, instructions for ongoing care as it relates to all pertinent caregivers, as well as preparing the medical discharge records, prescriptions, and/or referrals as applicable, is 40 minutes.    Darlin Priestly, MD  [facility] 02/08/2023, 2:42 PM

## 2023-02-08 NOTE — Plan of Care (Signed)
  Problem: Nutrition: Goal: Adequate nutrition will be maintained Outcome: Progressing   Problem: Safety: Goal: Ability to remain free from injury will improve Outcome: Progressing   

## 2023-02-08 NOTE — Care Management Important Message (Signed)
Important Message  Patient Details  Name: Christopher Fields MRN: [id number] Date of Birth: [birth year]   Important Message Given:  Yes - Medicare IM     [name] 02/08/2023, 11:39 AM

## 2023-02-08 NOTE — TOC Progression Note (Signed)
Transition of Care [facility]) - Progression Note    Patient Details  Name: STETSEN NEPTUNE MRN: [id number] Date of Birth: 02-Aug-1934  Transition of Care [facility]) CM/SW Contact  Chapman Fitch, RN Phone Number: 02/08/2023, 3:53 PM  Clinical Narrative:      Notified that Therapy has upgraded recs to home health Per MD she has spoken with daughter Okey Regal and she is in agreement to home health and will transport today  Call placed to Okey Regal to review choice of home health agency.  VM full. Text message sent requesting call back Expected Discharge Plan:  (SNF VS HOME) Barriers to Discharge: Continued Medical Work up  Expected Discharge Plan and Services In-house Referral: Clinical Social Work     Living arrangements for the past 2 months: Single Family Home Expected Discharge Date: 02/08/23                                     Social Determinants of Health (SDOH) Interventions SDOH Screenings   Food Insecurity: No Food Insecurity (02/07/2023)  Housing: Low Risk  (02/07/2023)  Transportation Needs: No Transportation Needs (02/07/2023)  Utilities: Not At Risk (02/07/2023)  Social Connections: Moderately Integrated (02/07/2023)  Tobacco Use: Medium Risk (02/05/2023)    Readmission Risk Interventions     No data to display

## 2023-02-08 NOTE — Plan of Care (Signed)

## 2023-02-08 NOTE — Progress Notes (Signed)
Christopher Fields to be discharged Home per MD order.    Allergies as of 02/08/2023       Reactions   Penicillins Itching, Rash        Medication List     STOP taking these medications    benzonatate 100 MG capsule Commonly known as: TESSALON   gabapentin 100 MG capsule Commonly known as: NEURONTIN   glipiZIDE 5 MG tablet Commonly known as: GLUCOTROL   potassium chloride SA 20 MEQ tablet Commonly known as: KLOR-CON M       TAKE these medications    amLODipine 5 MG tablet Commonly known as: NORVASC Take 5 mg by mouth daily.   aspirin 81 MG tablet Take 81 mg by mouth daily.   atorvastatin 80 MG tablet Commonly known as: LIPITOR Take 80 mg by mouth daily.   Chest Congestion Relief 400 MG Tabs tablet Generic drug: guaifenesin Take 400 mg by mouth every 6 (six) hours as needed (congestion).   citalopram 20 MG tablet Commonly known as: CELEXA Take 20 mg by mouth daily.   metFORMIN 500 MG tablet Commonly known as: GLUCOPHAGE Take 1,000 mg by mouth 2 (two) times daily.   pantoprazole 40 MG tablet Commonly known as: PROTONIX Take 1 tablet (40 mg total) by mouth daily.        Vitals:   02/08/23 0418 02/08/23 0828  BP: 130/82 (!) 118/53  Pulse: 78 61  Resp: 20 15  Temp: 98.6 F (37 C) 97.9 F (36.6 C)  SpO2: 95% 95%    IV catheter discontinued intact. Site without signs and symptoms of complications. Dressing and pressure applied. Patient denies pain at this time. No complaints noted.  An After Visit Summary was printed and given to the patient family. Patient escorted via wheelchair and discharged Home home via private auto.  Christopher Fields D Christopher Fields
